# Patient Record
Sex: Female | Born: 1960 | Race: Black or African American | Hispanic: No | Marital: Married | Smoking: Current some day smoker
Health system: Southern US, Community
[De-identification: ages and names within clinical notes are randomized; demographics above are authoritative.]

## PROBLEM LIST (undated history)

## (undated) DIAGNOSIS — Z87891 Personal history of nicotine dependence: Secondary | ICD-10-CM

## (undated) DIAGNOSIS — F17200 Nicotine dependence, unspecified, uncomplicated: Secondary | ICD-10-CM

## (undated) DIAGNOSIS — R569 Unspecified convulsions: Secondary | ICD-10-CM

## (undated) DIAGNOSIS — N289 Disorder of kidney and ureter, unspecified: Secondary | ICD-10-CM

## (undated) DIAGNOSIS — T8859XA Other complications of anesthesia, initial encounter: Secondary | ICD-10-CM

## (undated) DIAGNOSIS — K921 Melena: Secondary | ICD-10-CM

## (undated) DIAGNOSIS — T4145XA Adverse effect of unspecified anesthetic, initial encounter: Secondary | ICD-10-CM

## (undated) DIAGNOSIS — I10 Essential (primary) hypertension: Secondary | ICD-10-CM

## (undated) DIAGNOSIS — K589 Irritable bowel syndrome without diarrhea: Secondary | ICD-10-CM

## (undated) DIAGNOSIS — K602 Anal fissure, unspecified: Secondary | ICD-10-CM

## (undated) DIAGNOSIS — G5603 Carpal tunnel syndrome, bilateral upper limbs: Secondary | ICD-10-CM

## (undated) DIAGNOSIS — E785 Hyperlipidemia, unspecified: Secondary | ICD-10-CM

## (undated) DIAGNOSIS — K219 Gastro-esophageal reflux disease without esophagitis: Secondary | ICD-10-CM

## (undated) HISTORY — PX: HAND SURGERY: SHX662

## (undated) HISTORY — PX: ABDOMINAL HYSTERECTOMY: SHX81

## (undated) HISTORY — PX: CHOLECYSTECTOMY: SHX55

## (undated) HISTORY — PX: KNEE SURGERY: SHX244

---
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
## 1999-02-18 ENCOUNTER — Emergency Department (HOSPITAL_COMMUNITY): Admission: EM | Admit: 1999-02-18 | Discharge: 1999-02-18 | Payer: Self-pay | Admitting: Emergency Medicine

## 1999-02-18 ENCOUNTER — Encounter: Payer: Self-pay | Admitting: Emergency Medicine

## 1999-05-05 ENCOUNTER — Encounter: Payer: Self-pay | Admitting: Emergency Medicine

## 1999-05-05 ENCOUNTER — Emergency Department (HOSPITAL_COMMUNITY): Admission: EM | Admit: 1999-05-05 | Discharge: 1999-05-05 | Payer: Self-pay | Admitting: Emergency Medicine

## 1999-07-27 ENCOUNTER — Encounter: Payer: Self-pay | Admitting: Emergency Medicine

## 1999-07-27 ENCOUNTER — Emergency Department (HOSPITAL_COMMUNITY): Admission: EM | Admit: 1999-07-27 | Discharge: 1999-07-27 | Payer: Self-pay | Admitting: Emergency Medicine

## 1999-12-01 ENCOUNTER — Encounter: Payer: Self-pay | Admitting: Emergency Medicine

## 1999-12-01 ENCOUNTER — Emergency Department (HOSPITAL_COMMUNITY): Admission: EM | Admit: 1999-12-01 | Discharge: 1999-12-01 | Payer: Self-pay | Admitting: Emergency Medicine

## 1999-12-21 ENCOUNTER — Emergency Department (HOSPITAL_COMMUNITY): Admission: EM | Admit: 1999-12-21 | Discharge: 1999-12-21 | Payer: Self-pay | Admitting: Emergency Medicine

## 2000-03-31 ENCOUNTER — Observation Stay (HOSPITAL_COMMUNITY): Admission: RE | Admit: 2000-03-31 | Discharge: 2000-04-01 | Payer: Self-pay | Admitting: *Deleted

## 2000-03-31 ENCOUNTER — Encounter (INDEPENDENT_AMBULATORY_CARE_PROVIDER_SITE_OTHER): Payer: Self-pay | Admitting: Specialist

## 2000-09-20 ENCOUNTER — Encounter: Payer: Self-pay | Admitting: Family Medicine

## 2000-09-20 ENCOUNTER — Ambulatory Visit (HOSPITAL_COMMUNITY): Admission: RE | Admit: 2000-09-20 | Discharge: 2000-09-20 | Payer: Self-pay | Admitting: Family Medicine

## 2001-01-17 ENCOUNTER — Encounter: Payer: Self-pay | Admitting: Emergency Medicine

## 2001-01-17 ENCOUNTER — Emergency Department (HOSPITAL_COMMUNITY): Admission: EM | Admit: 2001-01-17 | Discharge: 2001-01-17 | Payer: Self-pay | Admitting: Emergency Medicine

## 2001-05-25 ENCOUNTER — Emergency Department (HOSPITAL_COMMUNITY): Admission: EM | Admit: 2001-05-25 | Discharge: 2001-05-26 | Payer: Self-pay

## 2001-10-05 ENCOUNTER — Encounter: Payer: Self-pay | Admitting: *Deleted

## 2001-10-05 ENCOUNTER — Emergency Department (HOSPITAL_COMMUNITY): Admission: EM | Admit: 2001-10-05 | Discharge: 2001-10-05 | Payer: Self-pay | Admitting: *Deleted

## 2002-01-16 ENCOUNTER — Encounter: Payer: Self-pay | Admitting: *Deleted

## 2002-01-16 ENCOUNTER — Emergency Department (HOSPITAL_COMMUNITY): Admission: EM | Admit: 2002-01-16 | Discharge: 2002-01-16 | Payer: Self-pay | Admitting: *Deleted

## 2002-02-10 ENCOUNTER — Ambulatory Visit (HOSPITAL_COMMUNITY): Admission: RE | Admit: 2002-02-10 | Discharge: 2002-02-10 | Payer: Self-pay | Admitting: Orthopedic Surgery

## 2002-02-10 ENCOUNTER — Encounter: Payer: Self-pay | Admitting: Orthopedic Surgery

## 2002-07-07 ENCOUNTER — Inpatient Hospital Stay (HOSPITAL_COMMUNITY): Admission: EM | Admit: 2002-07-07 | Discharge: 2002-07-07 | Payer: Self-pay | Admitting: Emergency Medicine

## 2002-07-07 ENCOUNTER — Encounter: Payer: Self-pay | Admitting: Emergency Medicine

## 2004-04-26 ENCOUNTER — Emergency Department (HOSPITAL_COMMUNITY): Admission: EM | Admit: 2004-04-26 | Discharge: 2004-04-26 | Payer: Self-pay | Admitting: Emergency Medicine

## 2004-09-10 ENCOUNTER — Emergency Department (HOSPITAL_COMMUNITY): Admission: EM | Admit: 2004-09-10 | Discharge: 2004-09-10 | Payer: Self-pay | Admitting: Emergency Medicine

## 2007-04-08 ENCOUNTER — Emergency Department (HOSPITAL_COMMUNITY): Admission: EM | Admit: 2007-04-08 | Discharge: 2007-04-08 | Payer: Self-pay | Admitting: Emergency Medicine

## 2007-06-29 ENCOUNTER — Emergency Department (HOSPITAL_COMMUNITY): Admission: EM | Admit: 2007-06-29 | Discharge: 2007-06-29 | Payer: Self-pay | Admitting: Emergency Medicine

## 2007-08-25 ENCOUNTER — Emergency Department (HOSPITAL_COMMUNITY): Admission: EM | Admit: 2007-08-25 | Discharge: 2007-08-25 | Payer: Self-pay | Admitting: Emergency Medicine

## 2007-09-26 ENCOUNTER — Emergency Department (HOSPITAL_COMMUNITY): Admission: EM | Admit: 2007-09-26 | Discharge: 2007-09-26 | Payer: Self-pay | Admitting: Emergency Medicine

## 2007-11-19 ENCOUNTER — Ambulatory Visit (HOSPITAL_COMMUNITY): Admission: RE | Admit: 2007-11-19 | Discharge: 2007-11-19 | Payer: Self-pay | Admitting: Internal Medicine

## 2008-01-15 ENCOUNTER — Other Ambulatory Visit: Admission: RE | Admit: 2008-01-15 | Discharge: 2008-01-15 | Payer: Self-pay | Admitting: Internal Medicine

## 2008-04-04 ENCOUNTER — Emergency Department (HOSPITAL_COMMUNITY): Admission: EM | Admit: 2008-04-04 | Discharge: 2008-04-04 | Payer: Self-pay | Admitting: Emergency Medicine

## 2008-12-13 ENCOUNTER — Emergency Department (HOSPITAL_COMMUNITY): Admission: EM | Admit: 2008-12-13 | Discharge: 2008-12-13 | Payer: Self-pay | Admitting: Emergency Medicine

## 2009-05-05 ENCOUNTER — Emergency Department (HOSPITAL_COMMUNITY): Admission: EM | Admit: 2009-05-05 | Discharge: 2009-05-05 | Payer: Self-pay | Admitting: Emergency Medicine

## 2009-06-09 ENCOUNTER — Emergency Department (HOSPITAL_COMMUNITY): Admission: EM | Admit: 2009-06-09 | Discharge: 2009-06-09 | Payer: Self-pay | Admitting: Emergency Medicine

## 2009-06-14 ENCOUNTER — Inpatient Hospital Stay (HOSPITAL_COMMUNITY): Admission: EM | Admit: 2009-06-14 | Discharge: 2009-06-16 | Payer: Self-pay | Admitting: Emergency Medicine

## 2009-06-16 ENCOUNTER — Encounter (INDEPENDENT_AMBULATORY_CARE_PROVIDER_SITE_OTHER): Payer: Self-pay | Admitting: Neurology

## 2009-11-20 ENCOUNTER — Emergency Department (HOSPITAL_COMMUNITY): Admission: EM | Admit: 2009-11-20 | Discharge: 2009-11-20 | Payer: Self-pay | Admitting: Emergency Medicine

## 2010-09-28 ENCOUNTER — Emergency Department (HOSPITAL_COMMUNITY)
Admission: EM | Admit: 2010-09-28 | Discharge: 2010-09-28 | Disposition: A | Discharge status: Home / Self Care | Payer: Self-pay | Attending: Emergency Medicine | Admitting: Emergency Medicine

## 2010-09-28 DIAGNOSIS — N764 Abscess of vulva: Secondary | ICD-10-CM | POA: Insufficient documentation

## 2010-09-28 DIAGNOSIS — A5901 Trichomonal vulvovaginitis: Secondary | ICD-10-CM | POA: Insufficient documentation

## 2010-09-28 LAB — URINE MICROSCOPIC-ADD ON

## 2010-09-28 LAB — URINALYSIS, ROUTINE W REFLEX MICROSCOPIC
Ketones, ur: NEGATIVE mg/dL
Leukocytes, UA: NEGATIVE
Specific Gravity, Urine: 1.02 (ref 1.005–1.030)
pH: 5 (ref 5.0–8.0)

## 2010-09-28 LAB — WET PREP, GENITAL: Yeast Wet Prep HPF POC: NONE SEEN

## 2010-09-29 LAB — GC/CHLAMYDIA PROBE AMP, GENITAL: GC Probe Amp, Genital: NEGATIVE

## 2010-09-30 ENCOUNTER — Emergency Department (HOSPITAL_COMMUNITY)
Admission: EM | Admit: 2010-09-30 | Discharge: 2010-09-30 | Disposition: A | Discharge status: Home / Self Care | Payer: Self-pay | Attending: Emergency Medicine | Admitting: Emergency Medicine

## 2010-09-30 DIAGNOSIS — L02219 Cutaneous abscess of trunk, unspecified: Secondary | ICD-10-CM | POA: Insufficient documentation

## 2010-09-30 LAB — URINE CULTURE: Culture: NO GROWTH

## 2010-10-28 ENCOUNTER — Other Ambulatory Visit (HOSPITAL_COMMUNITY): Payer: Self-pay | Admitting: *Deleted

## 2010-10-28 DIAGNOSIS — Z139 Encounter for screening, unspecified: Secondary | ICD-10-CM

## 2010-11-04 ENCOUNTER — Ambulatory Visit (HOSPITAL_COMMUNITY): Payer: PRIVATE HEALTH INSURANCE

## 2010-11-08 ENCOUNTER — Ambulatory Visit (HOSPITAL_COMMUNITY)
Admission: RE | Admit: 2010-11-08 | Discharge: 2010-11-08 | Disposition: A | Discharge status: Home / Self Care | Payer: PRIVATE HEALTH INSURANCE | Source: Ambulatory Visit | Attending: Family Medicine | Admitting: Family Medicine

## 2010-11-08 ENCOUNTER — Other Ambulatory Visit (HOSPITAL_COMMUNITY): Payer: Self-pay | Admitting: Family Medicine

## 2010-11-08 DIAGNOSIS — Z1231 Encounter for screening mammogram for malignant neoplasm of breast: Secondary | ICD-10-CM | POA: Insufficient documentation

## 2010-11-08 DIAGNOSIS — Z139 Encounter for screening, unspecified: Secondary | ICD-10-CM

## 2010-11-09 ENCOUNTER — Other Ambulatory Visit: Payer: Self-pay | Admitting: Family Medicine

## 2010-11-09 DIAGNOSIS — R928 Other abnormal and inconclusive findings on diagnostic imaging of breast: Secondary | ICD-10-CM

## 2010-11-17 LAB — LIPID PANEL
Cholesterol: 222 mg/dL — ABNORMAL HIGH (ref 0–200)
HDL: 38 mg/dL — ABNORMAL LOW (ref >39)
Total CHOL/HDL Ratio: 5.8 RATIO
Triglycerides: 219 mg/dL — ABNORMAL HIGH (ref <150)

## 2010-11-17 LAB — GLUCOSE, CAPILLARY
Glucose-Capillary: 151 mg/dL — ABNORMAL HIGH (ref 70–99)
Glucose-Capillary: 170 mg/dL — ABNORMAL HIGH (ref 70–99)
Glucose-Capillary: 185 mg/dL — ABNORMAL HIGH (ref 70–99)

## 2010-11-18 LAB — URINALYSIS, ROUTINE W REFLEX MICROSCOPIC
Bilirubin Urine: NEGATIVE
Glucose, UA: 500 mg/dL — AB
Glucose, UA: NEGATIVE mg/dL
Specific Gravity, Urine: 1.025 (ref 1.005–1.030)
Specific Gravity, Urine: 1.023 (ref 1.005–1.030)
Urobilinogen, UA: 0.2 mg/dL (ref 0.0–1.0)
Urobilinogen, UA: 0.2 mg/dL (ref 0.0–1.0)
pH: 6 (ref 5.0–8.0)
pH: 5.5 (ref 5.0–8.0)

## 2010-11-18 LAB — BASIC METABOLIC PANEL
CO2: 30 mEq/L (ref 19–32)
Calcium: 9.5 mg/dL (ref 8.4–10.5)
Chloride: 104 mEq/L (ref 96–112)
Glucose, Bld: 185 mg/dL — ABNORMAL HIGH (ref 70–99)
Sodium: 139 mEq/L (ref 135–145)

## 2010-11-18 LAB — CBC
HCT: 41.4 % (ref 36.0–46.0)
HCT: 43.7 % (ref 36.0–46.0)
Hemoglobin: 14.4 g/dL (ref 12.0–15.0)
Hemoglobin: 15.1 g/dL — ABNORMAL HIGH (ref 12.0–15.0)
MCHC: 34.7 g/dL (ref 30.0–36.0)
MCHC: 34.6 g/dL (ref 30.0–36.0)
MCV: 92.3 fL (ref 78.0–100.0)
MCV: 92 fL (ref 78.0–100.0)
RBC: 4.75 MIL/uL (ref 3.87–5.11)
RDW: 13.6 % (ref 11.5–15.5)
RDW: 13.6 % (ref 11.5–15.5)

## 2010-11-18 LAB — COMPREHENSIVE METABOLIC PANEL
ALT: 21 U/L (ref 0–35)
ALT: 26 U/L (ref 0–35)
Alkaline Phosphatase: 58 U/L (ref 39–117)
Alkaline Phosphatase: 64 U/L (ref 39–117)
BUN: 11 mg/dL (ref 6–23)
BUN: 14 mg/dL (ref 6–23)
CO2: 18 mEq/L — ABNORMAL LOW (ref 19–32)
Chloride: 106 mEq/L (ref 96–112)
GFR calc non Af Amer: >60 mL/min (ref >60)
Glucose, Bld: 139 mg/dL — ABNORMAL HIGH (ref 70–99)
Glucose, Bld: 150 mg/dL — ABNORMAL HIGH (ref 70–99)
Potassium: 3.9 mEq/L (ref 3.5–5.1)
Potassium: 4.6 mEq/L (ref 3.5–5.1)
Sodium: 138 mEq/L (ref 135–145)
Sodium: 138 mEq/L (ref 135–145)
Total Bilirubin: 0.8 mg/dL (ref 0.3–1.2)
Total Bilirubin: 0.9 mg/dL (ref 0.3–1.2)
Total Protein: 6.8 g/dL (ref 6.0–8.3)

## 2010-11-18 LAB — URINE MICROSCOPIC-ADD ON

## 2010-11-18 LAB — CK TOTAL AND CKMB (NOT AT ARMC)
CK, MB: 0.9 ng/mL (ref 0.3–4.0)
Total CK: 101 U/L (ref 7–177)

## 2010-11-18 LAB — DIFFERENTIAL
Basophils Absolute: 0 10*3/uL (ref 0.0–0.1)
Basophils Absolute: 0.1 10*3/uL (ref 0.0–0.1)
Basophils Relative: 1 % (ref 0–1)
Basophils Relative: 1 % (ref 0–1)
Eosinophils Absolute: 0.4 10*3/uL (ref 0.0–0.7)
Eosinophils Absolute: 0.2 10*3/uL (ref 0.0–0.7)
Eosinophils Relative: 3 % (ref 0–5)
Monocytes Absolute: 0.8 10*3/uL (ref 0.1–1.0)
Monocytes Absolute: 0.9 10*3/uL (ref 0.1–1.0)
Neutro Abs: 6.3 10*3/uL (ref 1.7–7.7)
Neutro Abs: 8.9 10*3/uL — ABNORMAL HIGH (ref 1.7–7.7)
Neutrophils Relative %: 66 % (ref 43–77)

## 2010-11-18 LAB — PROTIME-INR: INR: 0.96 (ref 0.00–1.49)

## 2010-11-18 LAB — HEMOGLOBIN A1C: Hgb A1c MFr Bld: 8 % — ABNORMAL HIGH (ref 4.6–6.1)

## 2010-11-18 LAB — APTT: aPTT: 29 seconds (ref 24–37)

## 2010-12-01 ENCOUNTER — Ambulatory Visit (HOSPITAL_COMMUNITY): Payer: PRIVATE HEALTH INSURANCE

## 2010-12-22 ENCOUNTER — Ambulatory Visit (HOSPITAL_COMMUNITY)
Admission: RE | Admit: 2010-12-22 | Discharge: 2010-12-22 | Disposition: A | Discharge status: Home / Self Care | Payer: PRIVATE HEALTH INSURANCE | Source: Ambulatory Visit | Attending: Family Medicine | Admitting: Family Medicine

## 2010-12-22 DIAGNOSIS — R928 Other abnormal and inconclusive findings on diagnostic imaging of breast: Secondary | ICD-10-CM
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
## 2010-12-31 NOTE — Discharge Summary (Signed)
NAME:  Madeline Simpson, Madeline Simpson                  ACCOUNT NO.:  [id number]   MEDICAL RECORD NO.:  [id number]                   PATIENT TYPE:  INP   LOCATION:  5012                                 FACILITY:  [facility]   PHYSICIAN:  Santiago Bumpers. Hensel, M.D.             DATE OF BIRTH:  27-Nov-1960   DATE OF ADMISSION:  07/06/2002  DATE OF DISCHARGE:  07/07/2002                                 DISCHARGE SUMMARY   ADMISSION DIAGNOSES:  1. Rectal bleeding.  2. Chronic abdominal pain.   DISCHARGE DIAGNOSES:  1. Rectal bleeding.  2. Chronic abdominal pain.  3. Irritable bowel syndrome.   DISCHARGE MEDICATIONS:  Albuterol MDI two puffs every six hours as needed  for wheezing.   CHRONIC PROBLEM LIST:  1. Asthma.  2. Possible history of seizure disorder.  3. History of hemorrhoids.  4. Status post cholecystectomy.  5. Status post total abdominal hysterectomy secondary to fibroid tumor.  6. Multiple hand and knee surgeries.  7. History of numerous tubular pregnancies.  8. Nose surgery in the past.  9. Chronic intermittent lower abdominal pain.   SERVICE:  Conservation officer, historic buildings.   RESIDENTS:  1. Dr. Emmit Alexanders.  2. Dr. Jeoffrey Massed.   CONSULTANTS:  None.   PROCEDURE:  CT of the abdomen and pelvis showed scattered sigmoid  diverticula, no acute bleeds.   HISTORY OF PRESENT ILLNESS:  The patient is a 50 year old African American  female who presented to [facility] Emergency Department with complaint of bloody,  mucusy diarrhea that had been occurring off and on for the last four months  associated with some lower abdominal cramping.  She has an extensive past  medical history concerning pain in multiple areas of her body and had had a  previous abdominal workup including a negative colonoscopy in the past but  she could not remember which physician did this.  Initial evaluation  revealed normal vital signs and an abdominal exam showing diffuse tenderness  everywhere without  other abnormality.  Rectal exam did show no stool in the  vault and heme-positive testing.  Hemoglobin was 13.5 and the remainder of  her laboratory work was unremarkable.  A CT of the abdomen and pelvis was  done which showed scattered sigmoid diverticula.  After initial evaluation  in the ER, she returned from the CT scanner and had sharp epigastric  abdominal pain and began hyperventilating and became unresponsive for a  brief period of time.  During this time, her vital signs were normal.  She  was admitted for further observation.   HOSPITAL COURSE:  RECTAL BLEEDING:  She had no documented rectal bleeding  after admission and her abdominal pain  resolved.  She was given one dose of  Toradol and no narcotics.  She did not have any nausea or vomiting.  In  discussion of her history, it is highly positive that she has irritable  bowel syndrome but also has a high suspicion for  a somatoform disorder and  possibly anxiety disorder with somatic manifestations.  Given no evidence of  an acute abdominal process, she was discharged home and will need followup  with her primary M.D., which she states is [facility].  She also may warrant a GI consult as an outpatient for further clarification  of her bowel disorder, if any.   DISPOSITION:  The patient was discharged home in improved condition.   DISCHARGE MEDICATIONS:  She was instructed to resume her previous before-  admission medications and was not given any new prescriptions on discharge.   DIET AND ACTIVITY:  No specific recommendations were given for diet and she  was not given any restrictions on her activity.     Jeoffrey Massed, M.D.                   William A. Leveda Anna, M.D.    PHM/MEDQ  D:  07/07/2002  T:  07/08/2002  Job:  [id number]

## 2010-12-31 NOTE — Op Note (Signed)
[facility]  Patient:    Madeline Simpson, Madeline Simpson               MRN: [id number] Proc. Date: 03/31/00 Adm. Date:  [id number] Disc. Date: [id number] Attending:  Kandis Mannan CC:         Jamesetta Geralds, M.D.   Operative Report  PREOPERATIVE DIAGNOSIS:  Chronic cholecystitis with cholelithiasis.  POSTOPERATIVE DIAGNOSIS:  Chronic cholecystitis with cholelithiasis.  OPERATION PERFORMED:  Laparoscopic cholecystectomy.  SURGEON:  Maisie Fus B. Samuella Cota, M.D.  ASSISTANT:  Adolph Pollack, M.D.  ANESTHESIA:  General.  ANESTHESIOLOGIST:  Lucille Passy, M.D.  DESCRIPTION OF PROCEDURE:  The patient was taken to the operating room and placed on the table in the supine position.  After a satisfactory general anesthetic with intubation, the entire abdomen was prepped and draped as a sterile field.  A small vertical infraumbilical incision was made through skin and subcutaneous tissue.  The patient had a previous lower midline incision and also a curved infraumbilical incision.  The fascia was divided in the midline and a finger was placed into the peritoneal cavity.  There were some adhesions to the anterior abdominal wall, but an opening was made towards the liver.  A pursestring suture of 0 Vicryl was placed and the Hasson trocar placed in the abdomen and the abdomen insufflated to 14 mmHg pressure.  The scope was used to look into the pelvis, with adhesions from previous surgery and I was unable to identify any ovaries.  Attention was then directed to the gallbladder.  A second 10 mm trocar was placed just to the right of the midline in the subxiphoid area.  Two 5 mm trocar were placed laterally.  The gallbladder had adhesions extending all the way to the fundus of the gallbladder.  These adhesions were taken down with general traction.  The cystic duct was readily identified.  The cystic artery was also readily identified.  The cystic duct was small and  the fact that the patient had normal liver function test, I elected not to do an operative cholangiogram.  The cystic duct was easily seen going into the gallbladder. The cystic duct was triply clipped on the remaining side and once on the gallbladder side and divided.  There were some small stones in the cystic duct which were suctioned out.  The patient had two cystic arteries which were dissected free and triply clipped on the remaining side and once on the gallbladder side and divided. The gallbladder was dissected from the bed.  This seemed to be a very small bile duct about 2/3 of the way up the gallbladder bed.  This was doubly clipped and divided.  After the gallbladder had been freed, it was placed in an endocatch bag because the cystic duct hemoclip became loose and there was some spillage of bile.  The gallbladder was easily removed after being placed in the endocatch bag.  The right upper quadrant was copiously irrigated with 2-liters of saline.  No evidence of any bleeding or bile leak.  The two lateral trocars were removed under direct vision and there was no bleeding. The pursestring suture at the infraumbilical incision was tied to give good closure of that fascial defect.  The abdomen was deflated and a second 10 mm trocar was removed.  The two midline incisions were closed with running subcuticular sutures of 4-0 Vicryl and the two lateral trocar sites were closed with single subcuticular 5-0 Vicryl.  Benzoin and 1/4 inch Steri-Strips were used  to reinforce the skin closure.  Marcaine 0.25% without epinephrine was injected at all sites except the infraumbilical site.  Because of the adhesions around the site, I elected not to place a needle in this area.  The patient seemed to tolerate the procedure well and was taken to the PACU in satisfactory condition. DD:  03/31/00 TD:  04/03/00 Job: [id number]

## 2011-01-10 ENCOUNTER — Emergency Department (HOSPITAL_COMMUNITY)
Admission: EM | Admit: 2011-01-10 | Discharge: 2011-01-10 | Disposition: A | Discharge status: Home / Self Care | Payer: Self-pay | Attending: Emergency Medicine | Admitting: Emergency Medicine

## 2011-01-10 DIAGNOSIS — L02219 Cutaneous abscess of trunk, unspecified: Secondary | ICD-10-CM | POA: Insufficient documentation

## 2011-01-10 DIAGNOSIS — L03319 Cellulitis of trunk, unspecified: Secondary | ICD-10-CM | POA: Insufficient documentation

## 2011-01-10 DIAGNOSIS — F17200 Nicotine dependence, unspecified, uncomplicated: Secondary | ICD-10-CM | POA: Insufficient documentation

## 2011-01-10 DIAGNOSIS — E119 Type 2 diabetes mellitus without complications: Secondary | ICD-10-CM | POA: Insufficient documentation

## 2011-01-10 LAB — BASIC METABOLIC PANEL
BUN: 14 mg/dL (ref 6–23)
Calcium: 9.8 mg/dL (ref 8.4–10.5)
Creatinine, Ser: 0.57 mg/dL (ref 0.4–1.2)
GFR calc non Af Amer: >60 mL/min (ref >60)

## 2011-01-10 LAB — DIFFERENTIAL
Basophils Absolute: 0.1 10*3/uL (ref 0.0–0.1)
Eosinophils Relative: 2 % (ref 0–5)
Lymphocytes Relative: 33 % (ref 12–46)
Lymphs Abs: 3.9 10*3/uL (ref 0.7–4.0)
Monocytes Absolute: 0.9 10*3/uL (ref 0.1–1.0)
Neutro Abs: 6.8 10*3/uL (ref 1.7–7.7)

## 2011-01-10 LAB — CBC
MCH: 30.6 pg (ref 26.0–34.0)
MCV: 88.6 fL (ref 78.0–100.0)
Platelets: 358 10*3/uL (ref 150–400)
RDW: 13 % (ref 11.5–15.5)

## 2011-01-10 LAB — GLUCOSE, CAPILLARY

## 2011-05-06 LAB — URINE MICROSCOPIC-ADD ON

## 2011-05-06 LAB — URINALYSIS, ROUTINE W REFLEX MICROSCOPIC
Ketones, ur: NEGATIVE
Nitrite: NEGATIVE
Protein, ur: NEGATIVE

## 2011-05-24 LAB — POCT CARDIAC MARKERS
CKMB, poc: <1 — ABNORMAL LOW
Myoglobin, poc: 38.4
Operator id: 106841

## 2011-05-24 LAB — DIFFERENTIAL
Basophils Absolute: 0.1
Basophils Relative: 1
Monocytes Relative: 6
Neutro Abs: 7.4
Neutrophils Relative %: 59

## 2011-05-24 LAB — CBC
MCHC: 34.1
Platelets: 419 — ABNORMAL HIGH
RBC: 4.24
RDW: 13.4

## 2011-05-24 LAB — URINALYSIS, ROUTINE W REFLEX MICROSCOPIC
Bilirubin Urine: NEGATIVE
Nitrite: NEGATIVE
Specific Gravity, Urine: 1.015
pH: 6.5

## 2011-05-24 LAB — URINE MICROSCOPIC-ADD ON

## 2011-05-24 LAB — BASIC METABOLIC PANEL
CO2: 29
Calcium: 9.2
Creatinine, Ser: 0.88
GFR calc Af Amer: >60

## 2011-10-10 ENCOUNTER — Other Ambulatory Visit: Payer: Self-pay

## 2011-10-10 ENCOUNTER — Inpatient Hospital Stay (HOSPITAL_COMMUNITY)
Admission: EM | Admit: 2011-10-10 | Discharge: 2011-10-13 | DRG: 313 | Disposition: A | Discharge status: Home / Self Care | Payer: 59 | Attending: Internal Medicine | Admitting: Internal Medicine

## 2011-10-10 ENCOUNTER — Emergency Department (HOSPITAL_COMMUNITY): Payer: 59

## 2011-10-10 ENCOUNTER — Encounter (HOSPITAL_COMMUNITY): Payer: Self-pay | Admitting: Adult Health

## 2011-10-10 DIAGNOSIS — R079 Chest pain, unspecified: Secondary | ICD-10-CM | POA: Diagnosis present

## 2011-10-10 DIAGNOSIS — R0789 Other chest pain: Principal | ICD-10-CM | POA: Diagnosis present

## 2011-10-10 DIAGNOSIS — E785 Hyperlipidemia, unspecified: Secondary | ICD-10-CM | POA: Diagnosis present

## 2011-10-10 DIAGNOSIS — Z90711 Acquired absence of uterus with remaining cervical stump: Secondary | ICD-10-CM | POA: Insufficient documentation

## 2011-10-10 DIAGNOSIS — Z8249 Family history of ischemic heart disease and other diseases of the circulatory system: Secondary | ICD-10-CM

## 2011-10-10 DIAGNOSIS — R42 Dizziness and giddiness: Secondary | ICD-10-CM | POA: Diagnosis present

## 2011-10-10 DIAGNOSIS — D72829 Elevated white blood cell count, unspecified: Secondary | ICD-10-CM | POA: Diagnosis present

## 2011-10-10 DIAGNOSIS — B379 Candidiasis, unspecified: Secondary | ICD-10-CM | POA: Diagnosis present

## 2011-10-10 DIAGNOSIS — I10 Essential (primary) hypertension: Secondary | ICD-10-CM | POA: Diagnosis present

## 2011-10-10 DIAGNOSIS — R197 Diarrhea, unspecified: Secondary | ICD-10-CM | POA: Diagnosis present

## 2011-10-10 DIAGNOSIS — Z9889 Other specified postprocedural states: Secondary | ICD-10-CM | POA: Insufficient documentation

## 2011-10-10 DIAGNOSIS — I951 Orthostatic hypotension: Secondary | ICD-10-CM | POA: Diagnosis present

## 2011-10-10 DIAGNOSIS — Z794 Long term (current) use of insulin: Secondary | ICD-10-CM

## 2011-10-10 DIAGNOSIS — Z9049 Acquired absence of other specified parts of digestive tract: Secondary | ICD-10-CM | POA: Insufficient documentation

## 2011-10-10 DIAGNOSIS — R109 Unspecified abdominal pain: Secondary | ICD-10-CM | POA: Diagnosis present

## 2011-10-10 DIAGNOSIS — Z87891 Personal history of nicotine dependence: Secondary | ICD-10-CM

## 2011-10-10 DIAGNOSIS — K625 Hemorrhage of anus and rectum: Secondary | ICD-10-CM | POA: Diagnosis present

## 2011-10-10 DIAGNOSIS — K5289 Other specified noninfective gastroenteritis and colitis: Secondary | ICD-10-CM | POA: Diagnosis present

## 2011-10-10 DIAGNOSIS — E119 Type 2 diabetes mellitus without complications: Secondary | ICD-10-CM | POA: Diagnosis present

## 2011-10-10 HISTORY — DX: Adverse effect of unspecified anesthetic, initial encounter: T41.45XA

## 2011-10-10 HISTORY — DX: Hyperlipidemia, unspecified: E78.5

## 2011-10-10 HISTORY — DX: Personal history of nicotine dependence: Z87.891

## 2011-10-10 HISTORY — DX: Other complications of anesthesia, initial encounter: T88.59XA

## 2011-10-10 HISTORY — DX: Essential (primary) hypertension: I10

## 2011-10-10 LAB — URINALYSIS, ROUTINE W REFLEX MICROSCOPIC
Leukocytes, UA: NEGATIVE
Nitrite: NEGATIVE
Protein, ur: 100 mg/dL — AB
Urobilinogen, UA: 0.2 mg/dL (ref 0.0–1.0)

## 2011-10-10 LAB — PROTIME-INR
INR: 0.88 (ref 0.00–1.49)
INR: 0.89 (ref 0.00–1.49)
Prothrombin Time: 12.1 seconds (ref 11.6–15.2)
Prothrombin Time: 12.2 seconds (ref 11.6–15.2)

## 2011-10-10 LAB — HEPATIC FUNCTION PANEL
ALT: 14 U/L (ref 0–35)
AST: 10 U/L (ref 0–37)
Albumin: 4 g/dL (ref 3.5–5.2)
Total Protein: 7.2 g/dL (ref 6.0–8.3)
Total Protein: 6.4 g/dL (ref 6.0–8.3)

## 2011-10-10 LAB — BASIC METABOLIC PANEL
Calcium: 9.8 mg/dL (ref 8.4–10.5)
Creatinine, Ser: 0.56 mg/dL (ref 0.50–1.10)
GFR calc non Af Amer: >90 mL/min (ref >90)
Sodium: 137 mEq/L (ref 135–145)

## 2011-10-10 LAB — POCT I-STAT TROPONIN I: Troponin i, poc: 0 ng/mL (ref 0.00–0.08)

## 2011-10-10 LAB — CARDIAC PANEL(CRET KIN+CKTOT+MB+TROPI): CK, MB: 1.7 ng/mL (ref 0.3–4.0)

## 2011-10-10 LAB — APTT: aPTT: 30 seconds (ref 24–37)

## 2011-10-10 LAB — CBC
MCH: 30.5 pg (ref 26.0–34.0)
MCHC: 35.2 g/dL (ref 30.0–36.0)
Platelets: 414 10*3/uL — ABNORMAL HIGH (ref 150–400)

## 2011-10-10 LAB — URINE MICROSCOPIC-ADD ON

## 2011-10-10 LAB — PRO B NATRIURETIC PEPTIDE: Pro B Natriuretic peptide (BNP): 27.7 pg/mL (ref 0–125)

## 2011-10-10 MED ORDER — OXYCODONE HCL 5 MG PO TABS
5.0000 mg | ORAL_TABLET | ORAL | Status: DC | PRN
  Administered 2011-10-11 – 2011-10-13 (×4): 5 mg via ORAL
  Filled 2011-10-10 (×4): qty 1

## 2011-10-10 MED ORDER — SODIUM CHLORIDE 0.9 % IJ SOLN: 3.0000 mL | INTRAMUSCULAR | Status: DC | PRN

## 2011-10-10 MED ORDER — SODIUM CHLORIDE 0.9 % IV SOLN
20.0000 mL | INTRAVENOUS | Status: DC
  Administered 2011-10-10: 1000 mL via INTRAVENOUS
  Administered 2011-10-11: 20 mL via INTRAVENOUS

## 2011-10-10 MED ORDER — ONDANSETRON HCL 4 MG/2ML IJ SOLN
4.0000 mg | Freq: Once | INTRAMUSCULAR | Status: AC
  Administered 2011-10-10: 4 mg via INTRAVENOUS
  Filled 2011-10-10: qty 2

## 2011-10-10 MED ORDER — SODIUM CHLORIDE 0.9 % IJ SOLN: 3.0000 mL | Freq: Two times a day (BID) | INTRAMUSCULAR | Status: DC

## 2011-10-10 MED ORDER — NITROGLYCERIN 0.4 MG SL SUBL: 0.4000 mg | SUBLINGUAL_TABLET | SUBLINGUAL | Status: DC | PRN

## 2011-10-10 MED ORDER — ENOXAPARIN SODIUM 40 MG/0.4ML ~~LOC~~ SOLN
40.0000 mg | SUBCUTANEOUS | Status: DC
  Administered 2011-10-10: 40 mg via SUBCUTANEOUS
  Filled 2011-10-10 (×3): qty 0.4

## 2011-10-10 MED ORDER — NAPHAZOLINE-PHENIRAMINE 0.025-0.3 % OP SOLN: 1.0000 [drp] | Freq: Four times a day (QID) | OPHTHALMIC | Status: DC | PRN

## 2011-10-10 MED ORDER — ONDANSETRON HCL 4 MG/2ML IJ SOLN
4.0000 mg | Freq: Four times a day (QID) | INTRAMUSCULAR | Status: DC | PRN
  Administered 2011-10-10 – 2011-10-11 (×2): 4 mg via INTRAVENOUS
  Filled 2011-10-10 (×2): qty 2

## 2011-10-10 MED ORDER — MORPHINE SULFATE 2 MG/ML IJ SOLN
2.0000 mg | INTRAMUSCULAR | Status: DC | PRN
  Administered 2011-10-10 – 2011-10-12 (×6): 2 mg via INTRAVENOUS
  Filled 2011-10-10 (×6): qty 1

## 2011-10-10 MED ORDER — PANTOPRAZOLE SODIUM 40 MG PO TBEC
40.0000 mg | DELAYED_RELEASE_TABLET | Freq: Every day | ORAL | Status: DC
  Administered 2011-10-11 – 2011-10-13 (×3): 40 mg via ORAL
  Filled 2011-10-10 (×6): qty 1

## 2011-10-10 MED ORDER — SODIUM CHLORIDE 0.9 % IV SOLN: 250.0000 mL | INTRAVENOUS | Status: DC | PRN

## 2011-10-10 MED ORDER — LISINOPRIL 5 MG PO TABS
5.0000 mg | ORAL_TABLET | Freq: Every day | ORAL | Status: DC
  Administered 2011-10-10 – 2011-10-11 (×2): 5 mg via ORAL
  Filled 2011-10-10 (×2): qty 1

## 2011-10-10 MED ORDER — ACETAMINOPHEN 650 MG RE SUPP: 650.0000 mg | Freq: Four times a day (QID) | RECTAL | Status: DC | PRN

## 2011-10-10 MED ORDER — SODIUM CHLORIDE 0.9 % IJ SOLN
3.0000 mL | Freq: Two times a day (BID) | INTRAMUSCULAR | Status: DC
  Administered 2011-10-11 – 2011-10-12 (×3): 3 mL via INTRAVENOUS

## 2011-10-10 MED ORDER — SODIUM CHLORIDE 0.9 % IV SOLN: 20.0000 mL | INTRAVENOUS | Status: DC

## 2011-10-10 MED ORDER — ASPIRIN EC 325 MG PO TBEC
325.0000 mg | DELAYED_RELEASE_TABLET | Freq: Every day | ORAL | Status: DC
  Filled 2011-10-10: qty 1

## 2011-10-10 MED ORDER — ADULT MULTIVITAMIN W/MINERALS CH
1.0000 | ORAL_TABLET | Freq: Every day | ORAL | Status: DC
  Administered 2011-10-11 – 2011-10-13 (×3): 1 via ORAL
  Filled 2011-10-10 (×3): qty 1

## 2011-10-10 MED ORDER — ALUM & MAG HYDROXIDE-SIMETH 200-200-20 MG/5ML PO SUSP: 30.0000 mL | Freq: Four times a day (QID) | ORAL | Status: DC | PRN

## 2011-10-10 MED ORDER — SIMVASTATIN 20 MG PO TABS
20.0000 mg | ORAL_TABLET | Freq: Every day | ORAL | Status: DC
  Administered 2011-10-11 – 2011-10-12 (×2): 20 mg via ORAL
  Filled 2011-10-10 (×3): qty 1

## 2011-10-10 MED ORDER — INSULIN GLARGINE 100 UNIT/ML ~~LOC~~ SOLN
10.0000 [IU] | Freq: Every day | SUBCUTANEOUS | Status: DC
  Administered 2011-10-10: 10 [IU] via SUBCUTANEOUS
  Filled 2011-10-10: qty 3

## 2011-10-10 MED ORDER — ACETAMINOPHEN 325 MG PO TABS: 650.0000 mg | ORAL_TABLET | Freq: Four times a day (QID) | ORAL | Status: DC | PRN

## 2011-10-10 MED ORDER — NITROGLYCERIN 2 % TD OINT: 1.0000 [in_us] | TOPICAL_OINTMENT | Freq: Four times a day (QID) | TRANSDERMAL | Status: DC

## 2011-10-10 MED ORDER — ASPIRIN EC 325 MG PO TBEC
325.0000 mg | DELAYED_RELEASE_TABLET | Freq: Every day | ORAL | Status: DC
  Administered 2011-10-11 – 2011-10-13 (×3): 325 mg via ORAL
  Filled 2011-10-10 (×3): qty 1

## 2011-10-10 MED ORDER — INSULIN ASPART 100 UNIT/ML ~~LOC~~ SOLN
0.0000 [IU] | Freq: Three times a day (TID) | SUBCUTANEOUS | Status: DC
  Administered 2011-10-11: 3 [IU] via SUBCUTANEOUS
  Administered 2011-10-11: 5 [IU] via SUBCUTANEOUS
  Administered 2011-10-11: 3 [IU] via SUBCUTANEOUS
  Administered 2011-10-12: 5 [IU] via SUBCUTANEOUS
  Administered 2011-10-12: 3 [IU] via SUBCUTANEOUS
  Administered 2011-10-12: 5 [IU] via SUBCUTANEOUS
  Administered 2011-10-13 (×2): 3 [IU] via SUBCUTANEOUS
  Filled 2011-10-10: qty 3

## 2011-10-10 MED ORDER — SENNOSIDES-DOCUSATE SODIUM 8.6-50 MG PO TABS
1.0000 | ORAL_TABLET | Freq: Every evening | ORAL | Status: DC | PRN
  Administered 2011-10-11: 1 via ORAL
  Filled 2011-10-10: qty 1

## 2011-10-10 MED ORDER — ONDANSETRON HCL 4 MG PO TABS: 4.0000 mg | ORAL_TABLET | Freq: Four times a day (QID) | ORAL | Status: DC | PRN

## 2011-10-10 MED ORDER — ASPIRIN 81 MG PO CHEW
324.0000 mg | CHEWABLE_TABLET | Freq: Once | ORAL | Status: AC
  Administered 2011-10-10: 324 mg via ORAL
  Filled 2011-10-10: qty 4

## 2011-10-10 NOTE — H&P (Signed)
Madeline Simpson MRN: [id number] DOB/AGE: 1960-10-28 51 y.o. Primary Care Physician: [facility] [place], [place]. Admit date: 10/10/2011 Chief Complaint: Chest pain HPI: Madeline Simpson is a 51 year old African American female with history of type 2 diabetes diagnosed in October of 2010, history of hypertension, hyperlipidemia, prior history of tobacco abuse quit 6 months ago family history of coronary artery disease on her father's side however no known premature coronary artery disease presented to the ED with left-sided substernal chest pain. Patient states that the night prior to admission experienced some chest pain while sitting down and taken phone calls at work. Patient described the chest pain as a pinprick and lasted anywhere from 30 minutes to 3 hours. Patient states that she did not think much of it at that time and breast above. On the day of admission was at work and answering phone calls patient did describe left substernal chest pain described as a heaviness which have been constant in nature until she presented to the ED. Patient states left sided chest pain radiating to the left upper extremity does have some associated shortness of breath, dizziness, palpitations/fluttering feeling, diaphoresis and nausea. Vision denies any emesis does endorse some burping and flattens however denies any heartburn symptoms. Patient also endorses a fever of 102 about a week prior to admission which has since resolved patient does endorse some chills and has been having some loose stools over the past week with some associated bright red blood per echo which she states has been ongoing. Patient does endorse to the use of some Aleve and ibuprofen. Patient denies any recent antibiotic use. Patient does endorse some generalized weakness. Patient denies any dysuria. Patient does endorse some polyuria. Patient was seen in the ED EKG which was done showed a normal sinus rhythm first set of troponin  was negative will call to admit the patient for further evaluation and management.  Past Medical History  Diagnosis Date  . Diabetes mellitus   . Hypertension   . HTN (hypertension) 10/10/2011  . DM (diabetes mellitus), type 2 10/10/2011  . Hyperlipidemia 10/10/2011  . S/P cholecystectomy 10/10/2011  . H/O right knee surgery 10/10/2011  . S/P partial hysterectomy 10/10/2011  . H/O hand surgery 10/10/2011  . H/O tobacco use, presenting hazards to health 10/10/2011    Past Surgical History  Procedure Date  . Cholecystectomy   . Abdominal hysterectomy     partial    Prior to Admission medications   Medication Sig Start Date End Date Taking? Authorizing Provider  ibuprofen (ADVIL,MOTRIN) 200 MG tablet Take 200 mg by mouth every 6 (six) hours as needed. For pain relief   Yes Historical Provider, MD  insulin glargine (LANTUS) 100 UNIT/ML injection Inject 10 Units into the skin at bedtime.   Yes Historical Provider, MD  lisinopril (PRINIVIL,ZESTRIL) 5 MG tablet Take 5 mg by mouth daily.   Yes Historical Provider, MD  metFORMIN (GLUCOPHAGE) 500 MG tablet Take 1,000 mg by mouth 2 (two) times daily with a meal.   Yes Historical Provider, MD  Multiple Vitamin (MULITIVITAMIN WITH MINERALS) TABS Take 1 tablet by mouth daily.   Yes Historical Provider, MD  naphazoline-pheniramine (NAPHCON-A) 0.025-0.3 % ophthalmic solution Place 1 drop into both eyes 4 (four) times daily as needed. For red eyes   Yes Historical Provider, MD  pravastatin (PRAVACHOL) 40 MG tablet Take 40 mg by mouth daily.   Yes Historical Provider, MD    Allergies:  Allergies  Allergen Reactions  . Codeine Hives, Itching  and Other (See Comments)    delirioius  . Naproxen Hives and Itching    History reviewed. No pertinent family history.  Social History:  reports that she quit smoking about 6 months ago. Her smoking use included Cigarettes. She has a 25 pack-year smoking history. She does not have any smokeless tobacco history  on file. She reports that she does not drink alcohol or use illicit drugs.  ROS: All systems reviewed with the patient and was positive as per HPI otherwise all other systems are negative.  PHYSICAL EXAM: Blood pressure 163/85, pulse 88, temperature 98.4 F (36.9 C), temperature source Oral, resp. rate 17, SpO2 100.00%. General: well-developed were marked in no acute cardiopulmonary distress.  HEENT: Normocephalic atraumatic. Pupils equal round and reactive to light and accommodation. Extraocular movements intact. Oropharynx is clear, no lesions, no exudates. Neck is supple no lymphadenopathy. No bruits, no goiter. Heart: Regular rate and rhythm, without murmurs, rubs, gallops. Lungs: Clear to auscultation bilaterally. Abdomen: Soft, nontender, nondistended, positive bowel sounds. Extremities: No clubbing cyanosis or edema with positive pedal pulses. Neuro: Alert and oriented x3. CN 2 through 12 are grossly intact. No focal deficits.     EKG: Normal sinus rhythm. No results found for this or any previous visit (from the past 240 hour(s)).   Lab results:  [facility] 10/10/11 1659  NA 137  K 3.8  CL 102  CO2 22  GLUCOSE 120*  BUN 10  CREATININE 0.56  CALCIUM 9.8  MG --  PHOS --    Basename 10/10/11 1659  AST 12  ALT 15  ALKPHOS 53  BILITOT 0.4  PROT 7.2  ALBUMIN 4.0   No results found for this basename: LIPASE:2,AMYLASE:2 in the last 72 hours  Basename 10/10/11 1659  WBC 12.2*  NEUTROABS --  HGB 13.4  HCT 38.1  MCV 86.8  PLT 414*   No results found for this basename: CKTOTAL:3,CKMB:3,CKMBINDEX:3,TROPONINI:3 in the last 72 hours No components found with this basename: POCBNP:3 No results found for this basename: DDIMER in the last 72 hours No results found for this basename: HGBA1C:2 in the last 72 hours No results found for this basename: CHOL:2,HDL:2,LDLCALC:2,TRIG:2,CHOLHDL:2,LDLDIRECT:2 in the last 72 hours No results found for this basename:  TSH,T4TOTAL,FREET3,T3FREE,THYROIDAB in the last 72 hours No results found for this basename: VITAMINB12:2,FOLATE:2,FERRITIN:2,TIBC:2,IRON:2,RETICCTPCT:2 in the last 72 hours Imaging results:  Dg Chest Portable 1 View  10/10/2011  *RADIOLOGY REPORT*  Clinical Data: Chest pain  PORTABLE CHEST - 1 VIEW  Comparison: 06/14/2009  Findings: Artifact overlies chest.  Heart size is normal. Mediastinal shadows are normal.  The lungs are clear.  No effusions.  No bony abnormalities.  IMPRESSION: No active disease  Original Report Authenticated By: Thomasenia Sales, M.D.   Impression/Plan:  Principal Problem:  *Chest pain Active Problems:  HTN (hypertension)  DM (diabetes mellitus), type 2  Hyperlipidemia  Leukocytosis  Dizziness - light-headed   #1 chest pain Patient is presenting with some left-sided chest pain with typical and atypical features. Patient does have multiple cardiac risk factors of type 2 diabetes, hypertension, hyperlipidemia, prior history of tobacco abuse. Will admit patient to telemetry bed. We'll cycle cardiac enzymes every 8 hours x3. Will check a d-dimer. Check a hemoglobin A1c, check a fasting lipid panel. Will check a 2-D echo. We'll place on oxygen, aspirin, nitroglycerin, morphine sulfate when necessary. Continue home regimen of statin, lisinopril. Will follow. If cardiac enzymes are elevated or 2-D echo is abnormal we'll need to consult with cardiology for further  evaluation of the inpatient stress test versus cardiac catheterization.  #2 leukocytosis Patient states she had a fever one week prior to admission. Patient also does complain of some loose stools. Will check a C. difficile PCR. Will check a UA with cultures and sensitivities. Chest x-ray was negative. Will follow.  #3 hypertension Continue home regimen of lisinopril.  #4 type 2 diabetes Continue oral dose of Lantus/11 and 10 units daily. Sliding scale insulin. Will hold oral hypoglycemic agents.  #5  hyperlipidemia Check a fasting lipid panel we'll place on a statin.  #6 dizziness Will check orthostatics. Monitor follow closely on telemetry.  #7 bright red blood per rectum Patient states this has been ongoing for several years space as she had colonoscopy endoscopies which have been unremarkable. Patient's hemoglobin is currently stable. We'll monitor H&H.  #8 diarrhea Check a C. difficile PCR. We'll place on contact precautions for now until C. difficile PCR is negative.  #9 prophylaxis Protonix for GI prophylaxis, Lovenox for DVT prophylaxis.  Aryon Nham 10/10/2011, 7:06 PM

## 2011-10-10 NOTE — ED Notes (Signed)
Pt ambulated to BR, urinated and then was ambulating back to her room when she fell landing on her L side. Smelling salts used and pts legs elevated and pt roused within seconds. Pt denies pain or injury and demonstrated full ROM actively. Pt was assisted back to bed , no injuries observed.

## 2011-10-10 NOTE — ED Notes (Addendum)
Chest pain that started yesterday radiates down left arm and intoleft chest associated with indigestion and nausea, SOB. Left fingers are numb and tingling. EKG done. Pt also rep[rts bleeding from rectum.

## 2011-10-10 NOTE — ED Provider Notes (Addendum)
History     CSN: [id number]  Arrival date & time 10/10/11  1533   First MD Initiated Contact with Patient 10/10/11 1549      Chief Complaint  Patient presents with  . Chest Pain    (Consider location/radiation/quality/duration/timing/severity/associated sxs/prior treatment) Patient is a 51 y.o. female presenting with chest pain. The history is provided by the patient.  Chest Pain The chest pain began yesterday. Chest pain occurs intermittently. The chest pain is unchanged. The severity of the pain is severe. The quality of the pain is described as heavy. The pain radiates to the left arm. Exacerbated by: not by eating or positions, movement might have made it worse. Primary symptoms include shortness of breath and nausea.    Yesterday she had a one hour episode of pin prick sensation in her left chest.  Then last night her heart started fluttering.  Today she felt her chest become heavy with tingling in her chest.  She also felt nauseated. Past Medical History  Diagnosis Date  . Diabetes mellitus   . Hypertension     No past surgical history on file.  No family history on file.  History  Substance Use Topics  . Smoking status: Never Smoker   . Smokeless tobacco: Not on file  . Alcohol Use: No    OB History    Grav Para Term Preterm Abortions TAB SAB Ect Mult Living                  Review of Systems  Respiratory: Positive for shortness of breath.   Cardiovascular: Positive for chest pain.  Gastrointestinal: Positive for nausea and blood in stool (ongoing for years).  All other systems reviewed and are negative.    Allergies  Codeine and Naproxen  Home Medications  No current outpatient prescriptions on file.  BP 156/95  Pulse 93  Temp(Src) 98.4 F (36.9 C) (Oral)  Resp 17  SpO2 100%  Physical Exam  Nursing note and vitals reviewed. Constitutional: She appears well-developed and well-nourished. No distress.  HENT:  Head: Normocephalic and  atraumatic.  Right Ear: External ear normal.  Left Ear: External ear normal.  Eyes: Conjunctivae are normal. Right eye exhibits no discharge. Left eye exhibits no discharge. No scleral icterus.  Neck: Neck supple. No tracheal deviation present.  Cardiovascular: Normal rate, regular rhythm and intact distal pulses.   Pulmonary/Chest: Effort normal and breath sounds normal. No stridor. No respiratory distress. She has no wheezes. She has no rales.  Abdominal: Soft. Bowel sounds are normal. She exhibits no distension. There is no tenderness. There is no rebound and no guarding.  Musculoskeletal: She exhibits no edema and no tenderness.  Neurological: She is alert. She has normal strength. No sensory deficit. Cranial nerve deficit:  no gross defecits noted. She exhibits normal muscle tone. She displays no seizure activity. Coordination normal.  Skin: Skin is warm and dry. No rash noted.  Psychiatric: She has a normal mood and affect.    ED Course  Procedures (including critical care time)  Date: 10/10/2011  Rate: 90  Rhythm: normal sinus rhythm  QRS Axis: normal  Intervals: normal  ST/T Wave abnormalities: normal  Conduction Disutrbances:none  Narrative Interpretation: Borderline R-wave progression anteriorly  Old EKG Reviewed: unchanged   Labs Reviewed  CBC - Abnormal; Notable for the following:    WBC 12.2 (*)    Platelets 414 (*)    All other components within normal limits  BASIC METABOLIC PANEL - Abnormal; Notable for  the following:    Glucose, Bld 120 (*)    All other components within normal limits  PRO B NATRIURETIC PEPTIDE  PROTIME-INR  APTT  HEPATIC FUNCTION PANEL  POCT I-STAT TROPONIN I   Dg Chest Portable 1 View  10/10/2011  *RADIOLOGY REPORT*  Clinical Data: Chest pain  PORTABLE CHEST - 1 VIEW  Comparison: 06/14/2009  Findings: Artifact overlies chest.  Heart size is normal. Mediastinal shadows are normal.  The lungs are clear.  No effusions.  No bony abnormalities.   IMPRESSION: No active disease  Original Report Authenticated By: Thomasenia Sales, M.D.    Diagnosis: Chest pain   MDM  Pt was escorted to the bathroom and had a syncopal episode.  Pt states she still has had intermittent heaviness.  With her cardiac risk factors, the patient warrants admission for further evaluation.  5:52 PM patient was reexamined after the fall in emergency department. She has no tenderness to palpation in her head her back. There are no significant injuries from this fall. I've explained the plan to the patient and she is comfortable with admission for further treatment and evaluation. She's not currently having any chest pain      Celene Kras, MD 10/10/11 1752  Celene Kras, MD 10/10/11 [phone]

## 2011-10-11 ENCOUNTER — Encounter (HOSPITAL_COMMUNITY): Payer: Self-pay | Admitting: Cardiology

## 2011-10-11 DIAGNOSIS — I369 Nonrheumatic tricuspid valve disorder, unspecified: Secondary | ICD-10-CM

## 2011-10-11 DIAGNOSIS — R079 Chest pain, unspecified: Secondary | ICD-10-CM

## 2011-10-11 LAB — DIFFERENTIAL
Basophils Absolute: 0 K/uL (ref 0.0–0.1)
Basophils Relative: 0 % (ref 0–1)
Eosinophils Absolute: 0.4 K/uL (ref 0.0–0.7)
Eosinophils Relative: 4 % (ref 0–5)
Lymphocytes Relative: 35 % (ref 12–46)
Lymphs Abs: 3.6 K/uL (ref 0.7–4.0)
Monocytes Absolute: 0.9 K/uL (ref 0.1–1.0)
Monocytes Relative: 9 % (ref 3–12)
Neutro Abs: 5.4 K/uL (ref 1.7–7.7)
Neutrophils Relative %: 52 % (ref 43–77)

## 2011-10-11 LAB — COMPREHENSIVE METABOLIC PANEL
ALT: 12 U/L (ref 0–35)
AST: 10 U/L (ref 0–37)
Albumin: 3.3 g/dL — ABNORMAL LOW (ref 3.5–5.2)
Alkaline Phosphatase: 44 U/L (ref 39–117)
Glucose, Bld: 205 mg/dL — ABNORMAL HIGH (ref 70–99)
Potassium: 3.7 mEq/L (ref 3.5–5.1)
Sodium: 136 mEq/L (ref 135–145)
Total Protein: 6.1 g/dL (ref 6.0–8.3)

## 2011-10-11 LAB — LIPID PANEL
Cholesterol: 150 mg/dL (ref 0–200)
HDL: 48 mg/dL
LDL Cholesterol: 48 mg/dL (ref 0–99)
Total CHOL/HDL Ratio: 3.1 ratio
Triglycerides: 269 mg/dL — ABNORMAL HIGH
VLDL: 54 mg/dL — ABNORMAL HIGH (ref 0–40)

## 2011-10-11 LAB — GLUCOSE, CAPILLARY
Glucose-Capillary: 268 mg/dL — ABNORMAL HIGH (ref 70–99)
Glucose-Capillary: 174 mg/dL — ABNORMAL HIGH (ref 70–99)
Glucose-Capillary: 220 mg/dL — ABNORMAL HIGH (ref 70–99)

## 2011-10-11 LAB — URINE CULTURE: Culture: NO GROWTH

## 2011-10-11 LAB — CARDIAC PANEL(CRET KIN+CKTOT+MB+TROPI)
CK, MB: 1.7 ng/mL (ref 0.3–4.0)
CK, MB: 1.6 ng/mL (ref 0.3–4.0)
Relative Index: INVALID (ref 0.0–2.5)
Relative Index: INVALID (ref 0.0–2.5)
Total CK: 57 U/L (ref 7–177)
Troponin I: <0.3 ng/mL (ref <0.30)
Troponin I: <0.3 ng/mL

## 2011-10-11 LAB — CBC
HCT: 35.6 % — ABNORMAL LOW (ref 36.0–46.0)
Hemoglobin: 12.3 g/dL (ref 12.0–15.0)
MCH: 30.1 pg (ref 26.0–34.0)
MCHC: 34.6 g/dL (ref 30.0–36.0)
MCV: 87 fL (ref 78.0–100.0)

## 2011-10-11 LAB — HEMOGLOBIN A1C: Mean Plasma Glucose: 229 mg/dL — ABNORMAL HIGH (ref <117)

## 2011-10-11 MED ORDER — FLUCONAZOLE 200 MG PO TABS
200.0000 mg | ORAL_TABLET | Freq: Once | ORAL | Status: AC
  Administered 2011-10-11: 200 mg via ORAL
  Filled 2011-10-11: qty 1

## 2011-10-11 MED ORDER — DIPHENHYDRAMINE HCL 25 MG PO CAPS
25.0000 mg | ORAL_CAPSULE | Freq: Once | ORAL | Status: AC
  Administered 2011-10-11: 25 mg via ORAL
  Filled 2011-10-11: qty 1

## 2011-10-11 MED ORDER — INSULIN GLARGINE 100 UNIT/ML ~~LOC~~ SOLN
15.0000 [IU] | Freq: Every day | SUBCUTANEOUS | Status: DC
  Administered 2011-10-11 – 2011-10-12 (×2): 15 [IU] via SUBCUTANEOUS

## 2011-10-11 MED ORDER — ONDANSETRON HCL 4 MG/2ML IJ SOLN: 4.0000 mg | Freq: Three times a day (TID) | INTRAMUSCULAR | Status: DC | PRN

## 2011-10-11 NOTE — Progress Notes (Signed)
UR CHART REVIEWED; B Hooria Gasparini RN, BSN, MHA 

## 2011-10-11 NOTE — Progress Notes (Signed)
Upon admission, orthostatic vitals taken. Pt was positive for orthostatic hypotension as her lying bp was 186/114 and her standing bp was 139/99. Upon standing, pt began to feel very lightheaded and dizzy. Pt sat down quickly and had a syncopal episode. On call MD made aware and orders were obtained to increase patients IV fluids over night and to re-check pts orthostatic vitals in the a.m. At this time patient says she feels better now that she is laying down. Will continue to monitor pt.

## 2011-10-11 NOTE — Progress Notes (Signed)
  Echocardiogram 2D Echocardiogram has been performed.  Cathie Beams Deneen 10/11/2011, 12:12 PM
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
## 2011-10-11 NOTE — Progress Notes (Signed)
Subjective: Patient still complaining of chest pain. Relates had some chest pain on Sunday but chest pain yesterday was different. It was needle, pressure, accompanied with SOB, diaphoresis. She also relates chest pain on exertion.  She had diarrhea last week, resolved. She has some abdominal pain bilateral on and off for last week. Pain getting better.   Objective: Filed Vitals:   10/11/11 0545 10/11/11 0546 10/11/11 0547 10/11/11 1010  BP: 149/95 146/96 134/84 145/90  Pulse: 86 84 90   Temp: 98.6 F (37 C)     TempSrc: Oral     Resp: 18     Height:      Weight:      SpO2: 96%      Weight change:   Intake/Output Summary (Last 24 hours) at 10/11/11 1128 Last data filed at 10/11/11 0935  Gross per 24 hour  Intake   1263 ml  Output    200 ml  Net   1063 ml    General: Alert, awake, oriented x3, in no acute distress.  HEENT: No bruits, no goiter.  Heart: Regular rate and rhythm, without murmurs, rubs, gallops.  Lungs: CTA, bilateral air movement.  Abdomen: Soft, mild tender, nondistended, positive bowel sounds.  Neuro: Grossly intact, nonfocal. Extremities; no edema  Lab Results:  Basename 10/11/11 0355 10/10/11 2045 10/10/11 1659  NA 136 -- 137  K 3.7 -- 3.8  CL 102 -- 102  CO2 24 -- 22  GLUCOSE 205* -- 120*  BUN 8 -- 10  CREATININE 0.64 -- 0.56  CALCIUM 9.3 -- 9.8  MG -- 1.5 --  PHOS -- -- --    Basename 10/11/11 0355 10/10/11 2045  AST 10 10  ALT 12 14  ALKPHOS 44 57  BILITOT 0.5 0.4  PROT 6.1 6.4  ALBUMIN 3.3* 3.6    Basename 10/11/11 0355 10/10/11 1659  WBC 10.3 12.2*  NEUTROABS 5.4 --  HGB 12.3 13.4  HCT 35.6* 38.1  MCV 87.0 86.8  PLT 379 414*    Basename 10/11/11 0355 10/10/11 2039  CKTOTAL 62 65  CKMB 1.7 1.7  CKMBINDEX -- --  TROPONINI <0.30 <0.30    Basename 10/10/11 2038  DDIMER 0.22    Basename 10/10/11 2045  HGBA1C 9.6*    Basename 10/11/11 0355  CHOL 150  HDL 48  LDLCALC 48  TRIG 269*  CHOLHDL 3.1  LDLDIRECT --     Basename 10/10/11 2045  TSH 1.224  T4TOTAL --  T3FREE --  THYROIDAB --    Basename 10/10/11 2038  VITAMINB12 419  FOLATE --  FERRITIN --  TIBC --  IRON --  RETICCTPCT --     Studies/Results: Dg Chest Portable 1 View  10/10/2011  *RADIOLOGY REPORT*  Clinical Data: Chest pain  PORTABLE CHEST - 1 VIEW  Comparison: 06/14/2009  Findings: Artifact overlies chest.  Heart size is normal. Mediastinal shadows are normal.  The lungs are clear.  No effusions.  No bony abnormalities.  IMPRESSION: No active disease  Original Report Authenticated By: Thomasenia Sales, M.D.    Medications: I have reviewed the patient's current medications.   Patient Active Hospital Problem List:  Chest pain (10/10/2011) With some typical feature, patient with multiple risk factors. Still complaining of chest pain, also chest pain on exertion. I will ask cardio to see patient. D dimer negative, cardiac enzymes negative. ECHO ordered.    HTN (hypertension) (10/10/2011)  hold lisinopril due to orthostatic.  DM (diabetes mellitus), type 2 (10/10/2011)   Uncontrolled. HBA1  c at 9. I will increase Lantus to 15 units.   Hyperlipidemia (10/10/2011) Continue with simvastatin.   Leukocytosis (10/10/2011)  resolved.  Dizziness - light-headed (10/10/2011) Likely secondary to orthostatic hypotension. Hold lisinopril. Continue with IV fluids.  Yeast infection; will give 1 dose fluconazole.   bright red blood per rectum  Patient states this has been ongoing for several years space as she had colonoscopy endoscopies which have been unremarkable. Patient's hemoglobin is currently stable. We'll monitor H&H. No blood per rectum recently.  diarrhea/abdominal pain; improved. ? Gastroenteritis.        LOS: 1 day   Seddrick Flax M.D.  [facility] 10/11/2011, 11:28 AM

## 2011-10-11 NOTE — Discharge Instructions (Signed)
   You have a Stress Test scheduled on 10/18/11 at 9:15am at [facility] Radiology Department. Your doctor has ordered this test to get a better idea of how your heart works.  Please arrive 15 minutes early for paperwork.   Instructions:  No food/drink after midnight the night before.   No caffeine/decaf products 24 hours before, including medicines such as Excedrin or Goody Powders. Call if there are any questions.   Wear comfortable clothes and shoes.   It is OK to take your morning meds with a sip of water EXCEPT for those types of medicines listed below or otherwise instructed.  Special Medication Instructions:  Beta blockers such as metoprolol (Lopressor/Toprol XL), atenolol (Tenormin), carvedilol (Coreg), nebivolol (Bystolic), propranolol (Inderal) should not be taken for 24 hours before the test.  Calcium channel blockers such as diltiazem (Cardizem) or verapmil (Calan) should not be taken for 24 hours before the test.  Remove nitroglycerin patches and do not take nitrate preparations such as Imdur/isosorbide the day of your test.  No Persantine/Theophylline or Aggrenox medicines should be used within 24 hours of the test.   What To Expect: The whole test will take several hours. When you arrive in the lab, the technician will inject a small amount of radioactive tracer into your arm through an IV while you are resting quietly. This helps [place] to form pictures of your heart. You will likely only feel a sting from the IV. After a waiting period, resting pictures will be obtained under a big camera. These are the "before" pictures.  Next, you will be prepped for the stress portion of the test. This may include either walking on a treadmill or receiving a medicine that helps to dilate blood vessels in your heart to simulate the effect of exercise on your heart. If you are walking on a treadmill, you will walk at different paces to try to get your heart rate to a goal number that  is based on your age. If your doctor has chosen the pharmacologic test, then you will receive a medicine through your IV that may cause temporary nausea, flushing, shortness of breath and sometimes chest discomfort or vomiting. This is typically short-lived and usually resolves quickly. Your blood pressure and heart rate will be monitored, and we will be watching your EKG on a computer screen for any changes. During this portion of the test, the radiologist will inject another small amount of radioactive tracer into your IV. After a waiting period, you will undergo a second set of pictures. These are the "after" pictures.  The doctor reading the test will compare the before-and-after images to look for evidence of heart blockages or heart weakness. In certain instances, this test is done over 2 days - your doctor will determine if this is needed.
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
## 2011-10-11 NOTE — Progress Notes (Signed)
Inpatient Diabetes Program Recommendations  AACE/ADA: New Consensus Statement on Inpatient Glycemic Control (2009)  Target Ranges:  Prepandial:   less than 140 mg/dL      Peak postprandial:   less than 180 mg/dL (1-2 hours)      Critically ill patients:  140 - 180 mg/dL   Reason for Visit: Elevated glucose and HgbA1C of 9.6%  Inpatient Diabetes Program Recommendations Insulin - Basal: Increase Lantus to 15 units qhs Insulin - Meal Coverage: Add Novolog 3 units TID for meal coverage Outpatient Referral: Please order outpatient diabetes education

## 2011-10-12 LAB — GLUCOSE, CAPILLARY
Glucose-Capillary: 238 mg/dL — ABNORMAL HIGH (ref 70–99)
Glucose-Capillary: 165 mg/dL — ABNORMAL HIGH (ref 70–99)
Glucose-Capillary: 236 mg/dL — ABNORMAL HIGH (ref 70–99)
Glucose-Capillary: 177 mg/dL — ABNORMAL HIGH (ref 70–99)
Glucose-Capillary: 230 mg/dL — ABNORMAL HIGH (ref 70–99)
Glucose-Capillary: 169 mg/dL — ABNORMAL HIGH (ref 70–99)

## 2011-10-12 LAB — CBC
Hemoglobin: 12.6 g/dL (ref 12.0–15.0)
MCH: 29.7 pg (ref 26.0–34.0)
MCHC: 34.1 g/dL (ref 30.0–36.0)
MCV: 87.3 fL (ref 78.0–100.0)
Platelets: 397 10*3/uL (ref 150–400)

## 2011-10-12 LAB — BASIC METABOLIC PANEL
Calcium: 8.9 mg/dL (ref 8.4–10.5)
Creatinine, Ser: 0.64 mg/dL (ref 0.50–1.10)
GFR calc non Af Amer: >90 mL/min (ref >90)
Glucose, Bld: 186 mg/dL — ABNORMAL HIGH (ref 70–99)
Sodium: 139 mEq/L (ref 135–145)

## 2011-10-12 MED ORDER — SODIUM CHLORIDE 0.9 % IV SOLN: 20.0000 mL | INTRAVENOUS | Status: DC

## 2011-10-12 MED ORDER — SODIUM CHLORIDE 0.9 % IV SOLN
1000.0000 mL | INTRAVENOUS | Status: DC
  Administered 2011-10-12 – 2011-10-13 (×3): 1000 mL via INTRAVENOUS

## 2011-10-12 NOTE — Progress Notes (Signed)
Subjective: Still with some dizziness. Chest pressure improved. Had a bowel movement.  Objective: Filed Vitals:   10/11/11 1608 10/11/11 2122 10/12/11 0448 10/12/11 0800  BP: 124/87 151/91 138/80 145/86  Pulse: 85 77 77 91  Temp:  98.9 F (37.2 C) 97.8 F (36.6 C) 98.6 F (37 C)  TempSrc:  Oral Oral Oral  Resp:  24 20 20   Height:      Weight:   91.581 kg (201 lb 14.4 oz)   SpO2: 98% 96% 95% 98%   Weight change: 5.851 kg (12 lb 14.4 oz)  Intake/Output Summary (Last 24 hours) at 10/12/11 0958 Last data filed at 10/12/11 0657  Gross per 24 hour  Intake 2182.5 ml  Output   1101 ml  Net 1081.5 ml    General: Alert, awake, oriented x3, in no acute distress.  HEENT: No bruits, no goiter.  Heart: Regular rate and rhythm, without murmurs, rubs, gallops.  Lungs: Crackles left side, bilateral air movement.  Abdomen: Soft, nontender, nondistended, positive bowel sounds.  Neuro: Grossly intact, nonfocal. Extremities; no edema.   Lab Results:  Basename 10/12/11 0436 10/11/11 0355 10/10/11 2045  NA 139 136 --  K 3.8 3.7 --  CL 105 102 --  CO2 23 24 --  GLUCOSE 186* 205* --  BUN 10 8 --  CREATININE 0.64 0.64 --  CALCIUM 8.9 9.3 --  MG -- -- 1.5  PHOS -- -- --    Basename 10/11/11 0355 10/10/11 2045  AST 10 10  ALT 12 14  ALKPHOS 44 57  BILITOT 0.5 0.4  PROT 6.1 6.4  ALBUMIN 3.3* 3.6   No results found for this basename: LIPASE:2,AMYLASE:2 in the last 72 hours  Basename 10/12/11 0436 10/11/11 0355  WBC 10.4 10.3  NEUTROABS -- 5.4  HGB 12.6 12.3  HCT 37.0 35.6*  MCV 87.3 87.0  PLT 397 379    Basename 10/11/11 1213 10/11/11 0355 10/10/11 2039  CKTOTAL 57 62 65  CKMB 1.6 1.7 1.7  CKMBINDEX -- -- --  TROPONINI <0.30 <0.30 <0.30   No components found with this basename: POCBNP:3  Basename 10/10/11 2038  DDIMER 0.22    Basename 10/10/11 2045  HGBA1C 9.6*    Basename 10/11/11 0355  CHOL 150  HDL 48  LDLCALC 48  TRIG 269*  CHOLHDL 3.1  LDLDIRECT --     Basename 10/10/11 2045  TSH 1.224  T4TOTAL --  T3FREE --  THYROIDAB --    Basename 10/10/11 2038  VITAMINB12 419  FOLATE --  FERRITIN --  TIBC --  IRON --  RETICCTPCT --    Micro Results: Recent Results (from the past 240 hour(s))  URINE CULTURE     Status: Normal   Collection Time   10/10/11  6:47 PM      Component Value Range Status Comment   Specimen Description URINE, CLEAN CATCH   Final    Special Requests NONE   Final    Culture  Setup Time [id number]   Final    Colony Count NO GROWTH   Final    Culture NO GROWTH   Final    Report Status 10/11/2011 FINAL   Final     Studies/Results: Dg Chest Portable 1 View  10/10/2011  *RADIOLOGY REPORT*  Clinical Data: Chest pain  PORTABLE CHEST - 1 VIEW  Comparison: 06/14/2009  Findings: Artifact overlies chest.  Heart size is normal. Mediastinal shadows are normal.  The lungs are clear.  No effusions.  No bony abnormalities.  IMPRESSION: No active disease  Original Report Authenticated By: Thomasenia Sales, M.D.    Medications: I have reviewed the patient's current medications.  Chest pain (10/10/2011)  . D dimer negative, cardiac enzymes negative. ECHO normal EF. Myoview out patient. Appreciate cardio.   HTN (hypertension) (10/10/2011) hold lisinopril due to orthostatic.  DM (diabetes mellitus), type 2 (10/10/2011) Uncontrolled. HBA1 c at 9. I will increase Lantus to 15 units.   Hyperlipidemia (10/10/2011) Continue with simvastatin.   Leukocytosis (10/10/2011) resolved.  Dizziness - light-headed (10/10/2011)  Likely secondary to orthostatic hypotension. Hold lisinopril. Increase IVF. Maybe component of autonomic dysfunction.  Yeast infection; will give 1 dose fluconazole. Resolved.  bright red blood per rectum  No episodes.  Patient states this has been ongoing for several years space as she had colonoscopy endoscopies which have been unremarkable. Patient's hemoglobin is currently stable. We'll monitor H&H. No blood  per rectum recently.   diarrhea/abdominal pain; improved. ? Gastroenteritis. Diarrhea resolved. C diff send this am.      LOS: 2 days   Belkis Norbeck M.D.  [facility] 10/12/2011, 9:58 AM

## 2011-10-13 LAB — GLUCOSE, CAPILLARY

## 2011-10-13 LAB — CLOSTRIDIUM DIFFICILE BY PCR: Toxigenic C. Difficile by PCR: NEGATIVE

## 2011-10-13 MED ORDER — PANTOPRAZOLE SODIUM 40 MG PO TBEC: 40.0000 mg | DELAYED_RELEASE_TABLET | Freq: Every day | ORAL | Status: DC

## 2011-10-13 MED ORDER — ASPIRIN 81 MG PO TABS: 81.0000 mg | ORAL_TABLET | Freq: Every day | ORAL | Status: DC

## 2011-10-13 MED ORDER — INSULIN GLARGINE 100 UNIT/ML ~~LOC~~ SOLN: 15.0000 [IU] | Freq: Every day | SUBCUTANEOUS | Status: DC

## 2011-10-13 NOTE — Discharge Summary (Signed)
Admit date: 10/10/2011 Discharge date: 10/13/2011  Primary Care Physician:  No primary provider on file.   Discharge Diagnoses:    . Chest pain, atypical, CI vs muscle skeletal pain.  Orthostatic hypotension, resolved. Gastroenteritis.  10/10/2011   . HTN (hypertension) 10/10/2011   . DM (diabetes mellitus), type 2 10/10/2011   . Hyperlipidemia 10/10/2011   . Leukocytosis 10/10/2011   . Dizziness - light-headed 10/10/2011              DISCHARGE MEDICATION: Medication List  As of 10/13/2011 10:42 AM   STOP taking these medications         ibuprofen 200 MG tablet      lisinopril 5 MG tablet         TAKE these medications         insulin glargine 100 UNIT/ML injection   Commonly known as: LANTUS   Inject 15 Units into the skin at bedtime.      metFORMIN 500 MG tablet   Commonly known as: GLUCOPHAGE   Take 1,000 mg by mouth 2 (two) times daily with a meal.      mulitivitamin with minerals Tabs   Take 1 tablet by mouth daily.      naphazoline-pheniramine 0.025-0.3 % ophthalmic solution   Commonly known as: NAPHCON-A   Place 1 drop into both eyes 4 (four) times daily as needed. For red eyes      pantoprazole 40 MG tablet   Commonly known as: PROTONIX   Take 1 tablet (40 mg total) by mouth daily at 6 (six) AM.      pravastatin 40 MG tablet   Commonly known as: PRAVACHOL   Take 40 mg by mouth daily.              Consults:  Cardiology, .    SIGNIFICANT DIAGNOSTIC STUDIES:  Dg Chest Portable 1 View  10/10/2011  *RADIOLOGY REPORT*  Clinical Data: Chest pain  PORTABLE CHEST - 1 VIEW  Comparison: 06/14/2009  Findings: Artifact overlies chest.  Heart size is normal. Mediastinal shadows are normal.  The lungs are clear.  No effusions.  No bony abnormalities.  IMPRESSION: No active disease  Original Report Authenticated By: Thomasenia Sales, M.D.     ECHO:    - Left ventricle: The cavity size was normal. There was mild concentric hypertrophy. Systolic  function was normal. The estimated ejection fraction was in the range of 55% to 65%. - Atrial septum: No defect or patent foramen ovale was identified.     Recent Results (from the past 240 hour(s))  URINE CULTURE     Status: Normal   Collection Time   10/10/11  6:47 PM      Component Value Range Status Comment   Specimen Description URINE, CLEAN CATCH   Final    Special Requests NONE   Final    Culture  Setup Time [id number]   Final    Colony Count NO GROWTH   Final    Culture NO GROWTH   Final    Report Status 10/11/2011 FINAL   Final   CLOSTRIDIUM DIFFICILE BY PCR     Status: Normal   Collection Time   10/12/11  3:58 PM      Component Value Range Status Comment   C difficile by pcr NEGATIVE  NEGATIVE  Final     BRIEF ADMITTING H & P: HPI: Madeline Simpson is a 51 year old African American female with history of type 2 diabetes diagnosed in October of  2010, history of hypertension, hyperlipidemia, prior history of tobacco abuse quit 6 months ago family history of coronary artery disease on her father's side however no known premature coronary artery disease presented to the ED with left-sided substernal chest pain. Patient states that the night prior to admission experienced some chest pain while sitting down and taken phone calls at work. Patient described the chest pain as a pinprick and lasted anywhere from 30 minutes to 3 hours. Patient states that she did not think much of it at that time and breast above. On the day of admission was at work and answering phone calls patient did describe left substernal chest pain described as a heaviness which have been constant in nature until she presented to the ED. Patient states left sided chest pain radiating to the left upper extremity does have some associated shortness of breath, dizziness, palpitations/fluttering feeling, diaphoresis and nausea. Vision denies any emesis does endorse some burping and flattens however denies any heartburn  symptoms. Patient also endorses a fever of 102 about a week prior to admission which has since resolved patient does endorse some chills and has been having some loose stools over the past week with some associated bright red blood per echo which she states has been ongoing. Patient does endorse to the use of some Aleve and ibuprofen. Patient denies any recent antibiotic use. Patient does endorse some generalized weakness. Patient denies any dysuria. Patient does endorse some polyuria. Patient was seen in the ED EKG which was done showed a normal sinus rhythm first set of troponin was negative will call to admit the patient for further evaluation and management.  Hospital Course;  Chest pain (10/10/2011)  Patient  was admitted to telemetry, cardiac enzymes x3 negative, EKG no ST segment elevation.  D dimer negative,  ECHO normal EF. Myoview out patient, recommended by cardiologist. Patient will follow with cardio [place].   HTN (hypertension) (10/10/2011) hold lisinopril due to orthostatic.  DM (diabetes mellitus), type 2 (10/10/2011) Uncontrolled. HBA1 c at 9. I will increase Lantus to 15 units. Needs to follow up with PCP.   Hyperlipidemia (10/10/2011) Continue with simvastatin.   Leukocytosis (10/10/2011) resolved.  Dizziness - light-headed (10/10/2011)  Likely secondary to orthostatic hypotension. Hold lisinopril.  Maybe component of autonomic dysfunction. Patient received IV fluids. Dizziness resolved.  Yeast infection; Patient recievd 1 dose fluconazole. Resolved.   History bright red blood per rectum  No episodes.  Patient states this has been ongoing for several years space as she had colonoscopy endoscopies which have been unremarkable. Patient's hemoglobin is currently stable. We'll monitor H&H. No blood per rectum recently.  Diarrhea/abdominal pain; Resolved.  Gastroenteritis. Diarrhea resolved. C diff negative.  Patient was discharged in stable condition.  Disposition and  Follow-up:  Discharge Orders    Future Appointments: Provider: Department: Dept Phone: Center:   10/18/2011 12:00 PM [facility] [phone] [facility]     Future Orders Please Complete By Expires   Diet - low sodium heart healthy      Increase activity slowly        Follow-up Information    Call [facility]. (FOR YOUR STRESS TEST. [facility] Radiology Department - please arrive 9:15am 10/18/11 (see instructions))    Contact information:   If you have any questions, contact the [employer] Office at [phone].          DISCHARGE EXAM:  General: Alert, awake, oriented x3, in no acute distress.  HEENT: No bruits, no goiter.  Heart: Regular rate and rhythm, without murmurs, rubs, gallops.  Lungs: Crackles left side, bilateral air movement.  Abdomen: Soft, nontender, nondistended, positive bowel sounds.  Neuro: Grossly intact, nonfocal.  Extremities; no edema.    Blood pressure 130/85, pulse 80, temperature 98.3 F (36.8 C), temperature source Oral, resp. rate 18, height 5\' 7"  (1.702 m), weight 91.445 kg (201 lb 9.6 oz), SpO2 99.00%.   Basename 10/12/11 0436 10/11/11 0355 10/10/11 2045  NA 139 136 --  K 3.8 3.7 --  CL 105 102 --  CO2 23 24 --  GLUCOSE 186* 205* --  BUN 10 8 --  CREATININE 0.64 0.64 --  CALCIUM 8.9 9.3 --  MG -- -- 1.5  PHOS -- -- --    Basename 10/11/11 0355 10/10/11 2045  AST 10 10  ALT 12 14  ALKPHOS 44 57  BILITOT 0.5 0.4  PROT 6.1 6.4  ALBUMIN 3.3* 3.6   Basename 10/12/11 0436 10/11/11 0355  WBC 10.4 10.3  NEUTROABS -- 5.4  HGB 12.6 12.3  HCT 37.0 35.6*  MCV 87.3 87.0  PLT 397 379    Signed: Jobe Mutch M.D. 10/13/2011, 10:42 AM

## 2011-10-25 ENCOUNTER — Other Ambulatory Visit: Payer: Self-pay

## 2011-10-27 ENCOUNTER — Ambulatory Visit (INDEPENDENT_AMBULATORY_CARE_PROVIDER_SITE_OTHER): Payer: 59

## 2011-10-27 ENCOUNTER — Encounter (HOSPITAL_COMMUNITY)
Admission: RE | Admit: 2011-10-27 | Discharge: 2011-10-27 | Disposition: A | Discharge status: Home / Self Care | Payer: 59 | Source: Ambulatory Visit | Attending: Cardiology | Admitting: Cardiology

## 2011-10-27 ENCOUNTER — Encounter (HOSPITAL_COMMUNITY): Payer: Self-pay

## 2011-10-27 DIAGNOSIS — R079 Chest pain, unspecified: Secondary | ICD-10-CM

## 2011-10-27 DIAGNOSIS — E785 Hyperlipidemia, unspecified: Secondary | ICD-10-CM | POA: Insufficient documentation

## 2011-10-27 DIAGNOSIS — E119 Type 2 diabetes mellitus without complications: Secondary | ICD-10-CM | POA: Insufficient documentation

## 2011-10-27 MED ORDER — TECHNETIUM TC 99M TETROFOSMIN IV KIT
30.0000 | PACK | Freq: Once | INTRAVENOUS | Status: AC | PRN
  Administered 2011-10-27: 30 via INTRAVENOUS

## 2011-10-27 MED ORDER — TECHNETIUM TC 99M TETROFOSMIN IV KIT
10.0000 | PACK | Freq: Once | INTRAVENOUS | Status: AC | PRN
  Administered 2011-10-27: 10.5 via INTRAVENOUS

## 2011-10-27 NOTE — Progress Notes (Signed)
Stress Lab Nurses Notes - Madeline Simpson 10/27/2011  Reason for doing test: Chest Pain Type of test: Stress Myoview Nurse performing test: Parke Poisson, RN Nuclear Medicine Tech: Lyndel Pleasure Echo Tech: Not Applicable MD performing test: Ival Bible & Joni Reining NP Family MD:  [facility] Test explained and consent signed: yes IV started: 22g jelco, Saline lock flushed, No redness or edema and Saline lock started in radiology Symptoms: fatigue and SOB.  Had some dizziness at the end while on TM assisted to chair. Treatment/Intervention: None Reason test stopped: fatigue and reached target HR After recovery IV was: Discontinued via X-ray tech and No redness or edema Patient to return to Nuc. Med at : 12:15 Patient discharged: Home Patient's Condition upon discharge was: stable Comments: Juice given prior to test. Patient feeling weak. "Better now".  During test peak BP 188/98 & HR 164. Had some dizziness on the TM at the end of exercising.  Recovery BP 158/90 & HR 106. Symptoms resolved in recovery. [name] T

## 2012-02-27 ENCOUNTER — Encounter (HOSPITAL_COMMUNITY): Payer: Self-pay | Admitting: Emergency Medicine

## 2012-02-27 ENCOUNTER — Inpatient Hospital Stay (HOSPITAL_COMMUNITY)
Admission: EM | Admit: 2012-02-27 | Discharge: 2012-03-01 | DRG: 638 | Disposition: A | Discharge status: Home / Self Care | Payer: 59 | Attending: Internal Medicine | Admitting: Internal Medicine

## 2012-02-27 DIAGNOSIS — Z888 Allergy status to other drugs, medicaments and biological substances status: Secondary | ICD-10-CM | POA: Diagnosis present

## 2012-02-27 DIAGNOSIS — Z90711 Acquired absence of uterus with remaining cervical stump: Secondary | ICD-10-CM

## 2012-02-27 DIAGNOSIS — E119 Type 2 diabetes mellitus without complications: Secondary | ICD-10-CM

## 2012-02-27 DIAGNOSIS — R42 Dizziness and giddiness: Secondary | ICD-10-CM

## 2012-02-27 DIAGNOSIS — E1100 Type 2 diabetes mellitus with hyperosmolarity without nonketotic hyperglycemic-hyperosmolar coma (NKHHC): Principal | ICD-10-CM | POA: Diagnosis present

## 2012-02-27 DIAGNOSIS — E785 Hyperlipidemia, unspecified: Secondary | ICD-10-CM | POA: Diagnosis present

## 2012-02-27 DIAGNOSIS — N390 Urinary tract infection, site not specified: Secondary | ICD-10-CM | POA: Diagnosis present

## 2012-02-27 DIAGNOSIS — Y9210 Unspecified residential institution as the place of occurrence of the external cause: Secondary | ICD-10-CM | POA: Diagnosis present

## 2012-02-27 DIAGNOSIS — Z87891 Personal history of nicotine dependence: Secondary | ICD-10-CM

## 2012-02-27 DIAGNOSIS — D72829 Elevated white blood cell count, unspecified: Secondary | ICD-10-CM

## 2012-02-27 DIAGNOSIS — Z9089 Acquired absence of other organs: Secondary | ICD-10-CM

## 2012-02-27 DIAGNOSIS — Z8249 Family history of ischemic heart disease and other diseases of the circulatory system: Secondary | ICD-10-CM

## 2012-02-27 DIAGNOSIS — Z9049 Acquired absence of other specified parts of digestive tract: Secondary | ICD-10-CM

## 2012-02-27 DIAGNOSIS — Z9889 Other specified postprocedural states: Secondary | ICD-10-CM

## 2012-02-27 DIAGNOSIS — I10 Essential (primary) hypertension: Secondary | ICD-10-CM | POA: Diagnosis present

## 2012-02-27 DIAGNOSIS — T3795XA Adverse effect of unspecified systemic anti-infective and antiparasitic, initial encounter: Secondary | ICD-10-CM | POA: Diagnosis present

## 2012-02-27 DIAGNOSIS — F17200 Nicotine dependence, unspecified, uncomplicated: Secondary | ICD-10-CM | POA: Diagnosis present

## 2012-02-27 DIAGNOSIS — R739 Hyperglycemia, unspecified: Secondary | ICD-10-CM | POA: Diagnosis present

## 2012-02-27 LAB — COMPREHENSIVE METABOLIC PANEL
ALT: 23 U/L (ref 0–35)
AST: 16 U/L (ref 0–37)
Alkaline Phosphatase: 75 U/L (ref 39–117)
CO2: 24 mEq/L (ref 19–32)
Calcium: 10.2 mg/dL (ref 8.4–10.5)
GFR calc Af Amer: >90 mL/min (ref >90)
Glucose, Bld: 569 mg/dL (ref 70–99)
Potassium: 4.1 mEq/L (ref 3.5–5.1)
Sodium: 136 mEq/L (ref 135–145)
Total Protein: 7.2 g/dL (ref 6.0–8.3)

## 2012-02-27 LAB — URINALYSIS, ROUTINE W REFLEX MICROSCOPIC
Leukocytes, UA: NEGATIVE
Nitrite: NEGATIVE
Specific Gravity, Urine: 1.042 — ABNORMAL HIGH (ref 1.005–1.030)
Urobilinogen, UA: 0.2 mg/dL (ref 0.0–1.0)
pH: 5 (ref 5.0–8.0)

## 2012-02-27 LAB — GLUCOSE, CAPILLARY
Glucose-Capillary: 484 mg/dL — ABNORMAL HIGH (ref 70–99)
Glucose-Capillary: 389 mg/dL — ABNORMAL HIGH (ref 70–99)

## 2012-02-27 LAB — CBC WITH DIFFERENTIAL/PLATELET
Basophils Absolute: 0.1 10*3/uL (ref 0.0–0.1)
Eosinophils Absolute: 0.2 10*3/uL (ref 0.0–0.7)
Lymphocytes Relative: 31 % (ref 12–46)
Lymphs Abs: 2.9 10*3/uL (ref 0.7–4.0)
Neutrophils Relative %: 59 % (ref 43–77)
Platelets: 385 10*3/uL (ref 150–400)
RBC: 4.79 MIL/uL (ref 3.87–5.11)
RDW: 12.9 % (ref 11.5–15.5)
WBC: 9.3 10*3/uL (ref 4.0–10.5)

## 2012-02-27 LAB — POCT I-STAT 3, VENOUS BLOOD GAS (G3P V)
Acid-base deficit: 1 mmol/L (ref 0.0–2.0)
O2 Saturation: 55 %

## 2012-02-27 LAB — URINE MICROSCOPIC-ADD ON

## 2012-02-27 MED ORDER — DEXTROSE 50 % IV SOLN: 25.0000 mL | INTRAVENOUS | Status: DC | PRN

## 2012-02-27 MED ORDER — SODIUM CHLORIDE 0.9 % IV SOLN
INTRAVENOUS | Status: DC
  Administered 2012-02-27: 4 [IU]/h via INTRAVENOUS
  Administered 2012-02-28: 7.5 [IU]/h via INTRAVENOUS
  Administered 2012-02-28: 9.6 [IU]/h via INTRAVENOUS
  Administered 2012-02-28: 4.2 [IU]/h via INTRAVENOUS
  Administered 2012-02-28: 9.5 [IU]/h via INTRAVENOUS
  Administered 2012-02-28: 9 [IU]/h via INTRAVENOUS
  Administered 2012-02-28: 9.7 [IU]/h via INTRAVENOUS
  Filled 2012-02-27: qty 1

## 2012-02-27 MED ORDER — DEXTROSE-NACL 5-0.45 % IV SOLN
INTRAVENOUS | Status: DC
  Administered 2012-02-28: 02:00:00 via INTRAVENOUS

## 2012-02-27 MED ORDER — HYDROMORPHONE HCL PF 1 MG/ML IJ SOLN
0.5000 mg | Freq: Once | INTRAMUSCULAR | Status: AC
  Administered 2012-02-27: 0.5 mg via INTRAVENOUS
  Filled 2012-02-27: qty 1

## 2012-02-27 MED ORDER — ONDANSETRON HCL 4 MG/2ML IJ SOLN
4.0000 mg | Freq: Three times a day (TID) | INTRAMUSCULAR | Status: AC | PRN
  Administered 2012-02-28: 4 mg via INTRAVENOUS
  Filled 2012-02-27: qty 2

## 2012-02-27 MED ORDER — HYDROMORPHONE HCL PF 1 MG/ML IJ SOLN
1.0000 mg | INTRAMUSCULAR | Status: AC | PRN
  Administered 2012-02-28 (×3): 1 mg via INTRAVENOUS
  Filled 2012-02-27 (×3): qty 1

## 2012-02-27 MED ORDER — SODIUM CHLORIDE 0.9 % IV SOLN: INTRAVENOUS | Status: DC

## 2012-02-27 MED ORDER — NYSTATIN 100000 UNIT/GM EX CREA
TOPICAL_CREAM | Freq: Two times a day (BID) | CUTANEOUS | Status: DC
  Administered 2012-02-27: 23:00:00 via TOPICAL
  Filled 2012-02-27 (×2): qty 15
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
## 2012-02-27 NOTE — ED Notes (Signed)
Critical high glucose high from lab 569

## 2012-02-27 NOTE — ED Notes (Signed)
Pt presented to ED with abdominal pain and hyperglycemia.

## 2012-02-27 NOTE — ED Notes (Signed)
Pt c/o abd pain and bloating with UTI sx with hematuria and pain with urination and wiping; pt sts leathargic and CBG reading in 300's which is high for norm

## 2012-02-28 DIAGNOSIS — N390 Urinary tract infection, site not specified: Secondary | ICD-10-CM

## 2012-02-28 DIAGNOSIS — R42 Dizziness and giddiness: Secondary | ICD-10-CM

## 2012-02-28 DIAGNOSIS — R7309 Other abnormal glucose: Secondary | ICD-10-CM

## 2012-02-28 DIAGNOSIS — Z87891 Personal history of nicotine dependence: Secondary | ICD-10-CM

## 2012-02-28 LAB — GLUCOSE, CAPILLARY
Glucose-Capillary: 221 mg/dL — ABNORMAL HIGH (ref 70–99)
Glucose-Capillary: 210 mg/dL — ABNORMAL HIGH (ref 70–99)
Glucose-Capillary: 196 mg/dL — ABNORMAL HIGH (ref 70–99)
Glucose-Capillary: 181 mg/dL — ABNORMAL HIGH (ref 70–99)
Glucose-Capillary: 172 mg/dL — ABNORMAL HIGH (ref 70–99)
Glucose-Capillary: 194 mg/dL — ABNORMAL HIGH (ref 70–99)
Glucose-Capillary: 315 mg/dL — ABNORMAL HIGH (ref 70–99)

## 2012-02-28 LAB — COMPREHENSIVE METABOLIC PANEL
ALT: 25 U/L (ref 0–35)
Alkaline Phosphatase: 56 U/L (ref 39–117)
CO2: 24 mEq/L (ref 19–32)
Chloride: 101 mEq/L (ref 96–112)
GFR calc Af Amer: >90 mL/min (ref >90)
GFR calc non Af Amer: >90 mL/min (ref >90)
Glucose, Bld: 204 mg/dL — ABNORMAL HIGH (ref 70–99)
Potassium: 3.1 mEq/L — ABNORMAL LOW (ref 3.5–5.1)
Sodium: 139 mEq/L (ref 135–145)
Total Bilirubin: 0.6 mg/dL (ref 0.3–1.2)

## 2012-02-28 LAB — HEMOGLOBIN A1C: Mean Plasma Glucose: 309 mg/dL — ABNORMAL HIGH (ref <117)

## 2012-02-28 LAB — MRSA PCR SCREENING: MRSA by PCR: NEGATIVE

## 2012-02-28 LAB — CBC
MCH: 30 pg (ref 26.0–34.0)
MCHC: 35.2 g/dL (ref 30.0–36.0)
RDW: 12.7 % (ref 11.5–15.5)

## 2012-02-28 MED ORDER — INSULIN GLARGINE 100 UNIT/ML ~~LOC~~ SOLN
12.0000 [IU] | SUBCUTANEOUS | Status: DC
  Administered 2012-02-28 – 2012-02-29 (×2): 12 [IU] via SUBCUTANEOUS

## 2012-02-28 MED ORDER — INSULIN GLARGINE 100 UNIT/ML ~~LOC~~ SOLN: 12.0000 [IU] | SUBCUTANEOUS | Status: DC

## 2012-02-28 MED ORDER — SODIUM CHLORIDE 0.9 % IV SOLN
INTRAVENOUS | Status: DC
  Administered 2012-02-28: 12.1 [IU]/h via INTRAVENOUS
  Administered 2012-02-28: 01:00:00 via INTRAVENOUS
  Administered 2012-02-28: 10.1 [IU]/h via INTRAVENOUS
  Filled 2012-02-28: qty 1

## 2012-02-28 MED ORDER — HYDRALAZINE HCL 20 MG/ML IJ SOLN
10.0000 mg | INTRAMUSCULAR | Status: DC | PRN
  Administered 2012-02-28: 10 mg via INTRAVENOUS
  Filled 2012-02-28: qty 1
  Filled 2012-02-28: qty 0.5

## 2012-02-28 MED ORDER — CIPROFLOXACIN IN D5W 400 MG/200ML IV SOLN
400.0000 mg | Freq: Two times a day (BID) | INTRAVENOUS | Status: DC
  Administered 2012-02-28: 400 mg via INTRAVENOUS
  Filled 2012-02-28 (×2): qty 200

## 2012-02-28 MED ORDER — ACETAMINOPHEN 325 MG PO TABS
650.0000 mg | ORAL_TABLET | ORAL | Status: DC | PRN
  Administered 2012-02-28 – 2012-03-01 (×5): 650 mg via ORAL
  Filled 2012-02-28 (×4): qty 2

## 2012-02-28 MED ORDER — NYSTATIN 100000 UNIT/GM EX POWD
Freq: Two times a day (BID) | CUTANEOUS | Status: DC
  Administered 2012-02-28 (×2): via TOPICAL
  Administered 2012-02-29: 1 g via TOPICAL
  Administered 2012-02-29 – 2012-03-01 (×2): via TOPICAL
  Filled 2012-02-28: qty 15

## 2012-02-28 MED ORDER — POTASSIUM CHLORIDE 20 MEQ/15ML (10%) PO LIQD
40.0000 meq | Freq: Once | ORAL | Status: AC
  Administered 2012-02-28: 40 meq via ORAL
  Filled 2012-02-28: qty 30

## 2012-02-28 MED ORDER — FLUCONAZOLE 150 MG PO TABS
150.0000 mg | ORAL_TABLET | Freq: Once | ORAL | Status: AC
  Administered 2012-02-28: 150 mg via ORAL
  Filled 2012-02-28: qty 1

## 2012-02-28 MED ORDER — INSULIN ASPART 100 UNIT/ML ~~LOC~~ SOLN
0.0000 [IU] | Freq: Three times a day (TID) | SUBCUTANEOUS | Status: DC
  Administered 2012-02-28: 2 [IU] via SUBCUTANEOUS
  Administered 2012-02-29: 8 [IU] via SUBCUTANEOUS
  Administered 2012-02-29: 11 [IU] via SUBCUTANEOUS

## 2012-02-28 MED ORDER — DIPHENHYDRAMINE HCL 50 MG/ML IJ SOLN
INTRAMUSCULAR | Status: AC
  Filled 2012-02-28: qty 1

## 2012-02-28 MED ORDER — HYDROCODONE-ACETAMINOPHEN 5-325 MG PO TABS
1.0000 | ORAL_TABLET | ORAL | Status: AC | PRN
  Administered 2012-02-28 – 2012-02-29 (×2): 1 via ORAL
  Filled 2012-02-28 (×2): qty 1

## 2012-02-28 MED ORDER — BLOOD PRESSURE CONTROL BOOK
Freq: Once | Status: AC
  Administered 2012-02-28: 03:00:00
  Filled 2012-02-28: qty 1

## 2012-02-28 MED ORDER — AMLODIPINE BESYLATE 5 MG PO TABS
5.0000 mg | ORAL_TABLET | Freq: Every day | ORAL | Status: DC
  Administered 2012-02-28 – 2012-02-29 (×2): 5 mg via ORAL
  Filled 2012-02-28 (×2): qty 1

## 2012-02-28 MED ORDER — LIVING WELL WITH DIABETES BOOK
Freq: Once | Status: AC
  Administered 2012-02-28: 03:00:00
  Filled 2012-02-28: qty 1

## 2012-02-28 MED ORDER — DIPHENHYDRAMINE HCL 50 MG/ML IJ SOLN
12.5000 mg | Freq: Once | INTRAMUSCULAR | Status: AC
  Administered 2012-02-28: 12.5 mg via INTRAVENOUS

## 2012-02-28 MED ORDER — SODIUM CHLORIDE 0.9 % IV SOLN
INTRAVENOUS | Status: DC
  Administered 2012-02-29: 07:00:00 via INTRAVENOUS

## 2012-02-28 MED ORDER — HYDRALAZINE HCL 20 MG/ML IJ SOLN
5.0000 mg | INTRAMUSCULAR | Status: DC | PRN
  Administered 2012-02-28: 5 mg via INTRAVENOUS
  Filled 2012-02-28: qty 1

## 2012-02-28 MED ORDER — LISINOPRIL 10 MG PO TABS
10.0000 mg | ORAL_TABLET | Freq: Every day | ORAL | Status: DC
  Administered 2012-02-28 – 2012-03-01 (×3): 10 mg via ORAL
  Filled 2012-02-28 (×3): qty 1

## 2012-02-28 MED ORDER — INSULIN REGULAR BOLUS VIA INFUSION
0.0000 [IU] | Freq: Three times a day (TID) | INTRAVENOUS | Status: DC
  Filled 2012-02-28: qty 10

## 2012-02-28 MED ORDER — POTASSIUM CHLORIDE 20 MEQ/15ML (10%) PO LIQD
ORAL | Status: AC
  Filled 2012-02-28: qty 30

## 2012-02-28 MED ORDER — INSULIN ASPART 100 UNIT/ML ~~LOC~~ SOLN
0.0000 [IU] | Freq: Every day | SUBCUTANEOUS | Status: DC
  Administered 2012-02-28: 4 [IU] via SUBCUTANEOUS

## 2012-02-28 MED ORDER — DEXTROSE 50 % IV SOLN: 25.0000 mL | INTRAVENOUS | Status: DC | PRN

## 2012-02-28 NOTE — Progress Notes (Signed)
I reviewed Ms Wilber's chart, saw and examined her at bed side. She was admitted with a groin rash/polyuria/polydipsia, and is being treated for hyperosmolar, nonketotic hyperglycemia. Apparently, she has not been compliant with meds as she says she could not afford them after she lost her insurance. I have had an extensive discussion with her regarding need to be compliant. Today she says she may be reacting to cipro as she is itching after taking insulin. Her UA does not suggest UTI. Unfortunately, I could not examine the rash due to the nystatin ointment. I have taken her off cipro, and will continue ivf, insulin drip, await Hba1c. If Hba1c>10, she will need an insulin combination at d/c, although she is not enthusiastic about insulin, otherwise she might d/c on metformin/glyburide?  Simbsio Zaylia Riolo,MD [phone]

## 2012-02-28 NOTE — Care Management Note (Addendum)
    Page 1 of 1   03/01/2012     3:14:42 PM   CARE MANAGEMENT NOTE 03/01/2012  Patient:  Madeline Simpson, Madeline Simpson   Account Number:  [id number]  Date Initiated:  02/28/2012  Documentation initiated by:  Junius Creamer  Subjective/Objective Assessment:   abd pain, hyperglycemia     Action/Plan:   lives w fam   Anticipated DC Date:  03/01/2012   Anticipated DC Plan:  HOME/SELF CARE      DC Planning Services  CM consult      Choice offered to / List presented to:             Status of service:  Completed, signed off Medicare Important Message given?   (If response is "NO", the following Medicare IM given date fields will be blank) Date Medicare IM given:   Date Additional Medicare IM given:    Discharge Disposition:  HOME/SELF CARE  Per UR Regulation:  Reviewed for med. necessity/level of care/duration of stay  If discussed at Long Length of Stay Meetings, dates discussed:    Comments:  03/01/12 15:13 Letha Cape RN, BSN [phone] patient dc to home today.  7/17 12n debbie dowell rn,bsn gave pt card that may help w copay and also pt assist forms for ins. pt has ins but copay for ins can be high. also gave her phne #'s that drug companies had for copay assist.  7/16 8:36a debbie dowell rn,bsn [phone] spoke w pt. she recently found new job and ins just started. she can get her meds now that has ins and job.

## 2012-02-28 NOTE — Progress Notes (Signed)
Patient admitted with hyperglycemia (glucose 569 mg/dl).  Per report, patient was only taking Metformin 1000 mg bid.  Was supposed to also be taking insulin, however, patient reports she lost her job and her health insurance and was not able to afford insulin at that time.  Not sure how long patient has been without insulin.    Noted patient was admitted to [facility] hospital in February of this year.  Per d/c summary, patient was discharged home on Lantus 15 units daily along with Metformin.  Patient was initially treated with IV fluids and IV insulin drip per GlucoStabilizer.  Has been on IV insulin drip since 2am today.  Patient has been eating meals (less than 50%), but has not received any IV insulin boluses to cover her carbohydrate intake.  As a result, we have not had four CBGs below 180 mg/dl consecutively.  Last BMET drawn this morning showed normal CO2 level.  Spoke with RN caring for this patient.  RN contacted Dr. Venetia Constable and attained transition orders to SQ insulin.  Patient to start on Lantus 12 units along with Moderate SSI.  Spoke with patient about the importance of good CBG control to prevent long-term and acute complications.  Explained to patient that we have drawn an A1c level and are awaiting the results.  Explained what an A1c measures and also explained to patient what her goal A1c is (<7% per ADA standards).  Patient told me she is able to give insulin to herself via vial and syringe method.  Currently has job and has Programmer, applications again.  Explained to patient that she may need to be d/c'd home on insulin pending her A1c results.    Because of her age, if her A1c is 9% or greater, recommend patient be d/c'd home on insulin.    Will continue to follow. Ambrose Finland RN, MSN, CDE Diabetes Coordinator Inpatient Diabetes Program [phone]
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
## 2012-02-29 DIAGNOSIS — E119 Type 2 diabetes mellitus without complications: Secondary | ICD-10-CM

## 2012-02-29 DIAGNOSIS — I10 Essential (primary) hypertension: Secondary | ICD-10-CM

## 2012-02-29 LAB — GLUCOSE, CAPILLARY
Glucose-Capillary: 251 mg/dL — ABNORMAL HIGH (ref 70–99)
Glucose-Capillary: 349 mg/dL — ABNORMAL HIGH (ref 70–99)
Glucose-Capillary: 197 mg/dL — ABNORMAL HIGH (ref 70–99)

## 2012-02-29 LAB — COMPREHENSIVE METABOLIC PANEL
ALT: 32 U/L (ref 0–35)
AST: 25 U/L (ref 0–37)
Calcium: 9.2 mg/dL (ref 8.4–10.5)
GFR calc Af Amer: >90 mL/min (ref >90)
Sodium: 138 mEq/L (ref 135–145)
Total Protein: 6.4 g/dL (ref 6.0–8.3)

## 2012-02-29 LAB — CBC
MCH: 29.9 pg (ref 26.0–34.0)
MCHC: 34.4 g/dL (ref 30.0–36.0)
Platelets: 345 10*3/uL (ref 150–400)

## 2012-02-29 MED ORDER — INSULIN ASPART 100 UNIT/ML ~~LOC~~ SOLN: 0.0000 [IU] | Freq: Every day | SUBCUTANEOUS | Status: DC

## 2012-02-29 MED ORDER — ASPIRIN 81 MG PO CHEW
CHEWABLE_TABLET | ORAL | Status: AC
  Filled 2012-02-29: qty 1

## 2012-02-29 MED ORDER — HYDROCHLOROTHIAZIDE 12.5 MG PO CAPS
12.5000 mg | ORAL_CAPSULE | Freq: Every day | ORAL | Status: DC
  Administered 2012-02-29 – 2012-03-01 (×2): 12.5 mg via ORAL
  Filled 2012-02-29 (×2): qty 1

## 2012-02-29 MED ORDER — INSULIN ASPART 100 UNIT/ML ~~LOC~~ SOLN
0.0000 [IU] | Freq: Three times a day (TID) | SUBCUTANEOUS | Status: DC
  Administered 2012-02-29: 11 [IU] via SUBCUTANEOUS
  Administered 2012-03-01: 7 [IU] via SUBCUTANEOUS
  Administered 2012-03-01: 11 [IU] via SUBCUTANEOUS

## 2012-02-29 MED ORDER — TRAMADOL HCL 50 MG PO TABS
50.0000 mg | ORAL_TABLET | Freq: Two times a day (BID) | ORAL | Status: DC | PRN
  Administered 2012-02-29: 50 mg via ORAL
  Filled 2012-02-29 (×2): qty 1

## 2012-02-29 MED ORDER — INSULIN NPH (HUMAN) (ISOPHANE) 100 UNIT/ML ~~LOC~~ SUSP
15.0000 [IU] | Freq: Two times a day (BID) | SUBCUTANEOUS | Status: DC
  Administered 2012-02-29 – 2012-03-01 (×2): 15 [IU] via SUBCUTANEOUS
  Filled 2012-02-29 (×2): qty 10

## 2012-02-29 MED ORDER — ASPIRIN EC 81 MG PO TBEC
81.0000 mg | DELAYED_RELEASE_TABLET | Freq: Every day | ORAL | Status: DC
  Administered 2012-02-29 – 2012-03-01 (×2): 81 mg via ORAL
  Filled 2012-02-29 (×2): qty 1

## 2012-02-29 NOTE — Progress Notes (Signed)
[facility] Progress Note Madeline Simpson   Madeline Simpson ZOX:[id number] DOB: [birth year] DOA: 02/27/2012 PCP: Sheila Oats, MD  Brief narrative: 51 year old female with history of diabetes who was previously on insulin but was stopped presenting with abdominal pain some dysuria and frequency polydipsia. Patient was found to have a sugar of more than 500.     Assessment/Plan:  Severely uncontrolled diabetes mellitus / nonketotic hyperosmolar state A1c found to be markedly elevated at 12.4 - CBGs are still not controlled with the 3 last levels greater than 250 and 2 of those greater than 300 - transition to NPH to satisfy financial concerns - up-titrate dose and follow CBG - already on ACE - add ASA  Hypertension Blood pressure is reasonably controlled at the present time  Hyperlipidemia LDL was favorable in February - unclear if she was on meds at that time - recheck fasting level in AM  Tobacco abuse Has been counseled on need to quite completely  Possible allergic reaction to ciprofloxacin Sx resolved after dosing stopped - no indication for further abx tx  Sciatic type pain Counseled on weight loss - trial of NSAID  Code Status: Full Family Communication: Directly with the patient at bedside Disposition Plan: transfer to medical bed - home when CBG better controlled  Consultants: None  Procedures: None  Antibiotics: None  HPI/Subjective: Pt c/o R buttock and thigh pain c/w sciatica.  No le weakness or loss of bowel or bladder control.  Denies f/c, sob, n/v, or abdom pain.     Objective: Blood pressure 135/87, pulse 93, temperature 98.4 F (36.9 C), temperature source Oral, resp. rate 16, height 5\' 7"  (1.702 m), weight 90.8 kg (200 lb 2.8 oz), SpO2 96.00%.  Intake/Output Summary (Last 24 hours) at 02/29/12 1421 Last data filed at 02/29/12 1400  Gross per 24 hour  Intake 2060.1 ml  Output      0 ml  Net 2060.1 ml      Exam: General: No acute respiratory distress Lungs: Clear to auscultation bilaterally without wheezes or crackles Cardiovascular: Regular rate and rhythm without murmur gallop or rub normal S1 and S2 Abdomen: overweight, nontender, nondistended, soft, bowel sounds positive, no rebound, no ascites, no appreciable mass Extremities: No significant cyanosis, clubbing, or edema bilateral lower extremities  Data Reviewed: Basic Metabolic Panel:  Lab 02/29/12 8119 02/28/12 0445 02/27/12 1727  NA 138 139 136  K 3.7 3.1* 4.1  CL 100 101 94*  CO2 23 24 24   GLUCOSE 231* 204* 569*  BUN 6 9 13   CREATININE 0.55 0.46* 0.72  CALCIUM 9.2 9.2 10.2  MG 1.9 -- --  PHOS 3.3 -- --   Liver Function Tests:  Lab 02/29/12 0604 02/28/12 0445 02/27/12 1727  AST 25 24 16   ALT 32 25 23  ALKPHOS 52 56 75  BILITOT 0.7 0.6 0.4  PROT 6.4 6.8 7.2  ALBUMIN 3.6 3.8 4.2   CBC:  Lab 02/29/12 0604 02/28/12 0445 02/27/12 1727  WBC 8.0 8.1 9.3  NEUTROABS -- -- 5.5  HGB 13.5 13.4 14.4  HCT 39.2 38.1 41.6  MCV 86.7 85.4 86.8  PLT 345 326 385   CBG:  Lab 02/29/12 1151 02/29/12 0814 02/29/12 0742 02/28/12 2129 02/28/12 1718  GLUCAP 251* 313* 362* 315* 128*    Recent Results (from the past 240 hour(s))  MRSA PCR SCREENING     Status: Normal   Collection Time   02/28/12  1:53 AM      Component  Value Range Status Comment   MRSA by PCR NEGATIVE  NEGATIVE Final      Studies:  Recent x-ray studies have been reviewed in detail by the Attending Physician  Scheduled Meds:  Reviewed in detail by the Attending Physician   Lonia Blood, MD [facility] Office  [phone] Pager [phone]  On-Call/Text Page:      [email or web address]      password [email or web address]  If 7PM-7AM, please contact night-coverage [email or web address] Password [email or web address] 02/29/2012, 2:21 PM   LOS: 2 days

## 2012-02-29 NOTE — Progress Notes (Signed)
Inpatient Diabetes Program Recommendations  AACE/ADA: New Consensus Statement on Inpatient Glycemic Control  Target Ranges:  Prepandial:   less than 140 mg/dL      Peak postprandial:   less than 180 mg/dL (1-2 hours)      Critically ill patients:  140 - 180 mg/dL  Pager:  [phone] Hours:  8 am-10pm   Reason for Visit: Elevated glucose:  231 mg/dL this am  Inpatient Diabetes Program Recommendations Insulin - Basal: Increase Lantus to 20 units daily Insulin - Meal Coverage: Add Novolog 4 units TID for meal coverage  Alfredia Client PhD, RN Diabetes Coordinator  Office:  [phone] Team Pager:  [phone]

## 2012-02-29 NOTE — Progress Notes (Signed)
Pt alert and oriented. Call bell within reach. Pt fall safety plan discussed and signed by pt and placed beside bathroom door. Skin intact.

## 2012-03-01 LAB — GLUCOSE, CAPILLARY: Glucose-Capillary: 255 mg/dL — ABNORMAL HIGH (ref 70–99)

## 2012-03-01 MED ORDER — INSULIN NPH (HUMAN) (ISOPHANE) 100 UNIT/ML ~~LOC~~ SUSP: 18.0000 [IU] | Freq: Two times a day (BID) | SUBCUTANEOUS | Status: DC

## 2012-03-01 MED ORDER — SIMVASTATIN 10 MG PO TABS: 10.0000 mg | ORAL_TABLET | Freq: Every day | ORAL | Status: DC

## 2012-03-01 MED ORDER — SIMVASTATIN 10 MG PO TABS
10.0000 mg | ORAL_TABLET | Freq: Every day | ORAL | Status: DC
  Filled 2012-03-01: qty 1

## 2012-03-01 MED ORDER — LISINOPRIL 10 MG PO TABS: 10.0000 mg | ORAL_TABLET | Freq: Every day | ORAL | Status: DC

## 2012-03-01 MED ORDER — INSULIN ASPART 100 UNIT/ML ~~LOC~~ SOLN
4.0000 [IU] | Freq: Three times a day (TID) | SUBCUTANEOUS | Status: DC
  Administered 2012-03-01: 4 [IU] via SUBCUTANEOUS

## 2012-03-01 MED ORDER — INSULIN ASPART 100 UNIT/ML ~~LOC~~ SOLN: 4.0000 [IU] | Freq: Three times a day (TID) | SUBCUTANEOUS | Status: DC

## 2012-03-01 MED ORDER — HYDROCHLOROTHIAZIDE 12.5 MG PO CAPS: 12.5000 mg | ORAL_CAPSULE | Freq: Every day | ORAL | Status: DC

## 2012-03-01 MED ORDER — ASPIRIN 81 MG PO TBEC: 81.0000 mg | DELAYED_RELEASE_TABLET | Freq: Every day | ORAL | Status: DC

## 2012-03-01 MED ORDER — METFORMIN HCL 500 MG PO TABS
1000.0000 mg | ORAL_TABLET | Freq: Two times a day (BID) | ORAL | Status: DC
  Filled 2012-03-01 (×2): qty 2

## 2012-03-01 NOTE — Progress Notes (Signed)
Inpatient Diabetes Program Recommendations  AACE/ADA: New Consensus Statement on Inpatient Glycemic Control (2009)  Target Ranges:  Prepandial:   less than 140 mg/dL      Peak postprandial:   less than 180 mg/dL (1-2 hours)      Critically ill patients:  140 - 180 mg/dL   Reason for Visit: Hyperglycemia and change in insulin regimen.  Inpatient Diabetes Program Recommendations Insulin - Basal: If to use NPH by itself, Please change pm dose of NPH to HS rather than before supper.  Might also consider changing to the Novolin 70/30 to include meal coverage which would translate to 24 units 70/30 twice a day, am and ac hs. Insulin - Meal Coverage: [id number]   Note: Thank you, Lenor Coffin, RN, CNS, Diabetes Coordinator [phone])
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
## 2012-03-01 NOTE — Progress Notes (Signed)
Madeline Simpson to be D/C'd Home per MD order.  Discussed with the patient the After Visit Summary and all questions fully answered. All IV's discontinued with no bleeding noted. All belongings returned to patient for patient to take home.   Last Vital Signs:  Blood pressure 154/96, pulse 96, temperature 96 F (35.6 C), temperature source Oral, resp. rate 18, height 5\' 7"  (1.702 m), weight 90.8 kg (200 lb 2.8 oz), SpO2 100.00%.  Susann Givens, RN, [facility] 03/01/2012 3:10 PM

## 2012-04-09 ENCOUNTER — Encounter (HOSPITAL_COMMUNITY): Payer: Self-pay | Admitting: Cardiology

## 2012-11-29 ENCOUNTER — Emergency Department (HOSPITAL_COMMUNITY): Payer: BC Managed Care – PPO

## 2012-11-29 ENCOUNTER — Encounter (HOSPITAL_COMMUNITY): Payer: Self-pay | Admitting: *Deleted

## 2012-11-29 ENCOUNTER — Inpatient Hospital Stay (HOSPITAL_COMMUNITY)
Admission: EM | Admit: 2012-11-29 | Discharge: 2012-12-02 | DRG: 320 | Disposition: A | Discharge status: Home / Self Care | Payer: BC Managed Care – PPO | Attending: Family Medicine | Admitting: Family Medicine

## 2012-11-29 DIAGNOSIS — Z885 Allergy status to narcotic agent status: Secondary | ICD-10-CM

## 2012-11-29 DIAGNOSIS — N10 Acute tubulo-interstitial nephritis: Principal | ICD-10-CM | POA: Diagnosis present

## 2012-11-29 DIAGNOSIS — E119 Type 2 diabetes mellitus without complications: Secondary | ICD-10-CM | POA: Diagnosis present

## 2012-11-29 DIAGNOSIS — E669 Obesity, unspecified: Secondary | ICD-10-CM | POA: Diagnosis present

## 2012-11-29 DIAGNOSIS — E785 Hyperlipidemia, unspecified: Secondary | ICD-10-CM | POA: Diagnosis present

## 2012-11-29 DIAGNOSIS — Z8249 Family history of ischemic heart disease and other diseases of the circulatory system: Secondary | ICD-10-CM

## 2012-11-29 DIAGNOSIS — N12 Tubulo-interstitial nephritis, not specified as acute or chronic: Secondary | ICD-10-CM

## 2012-11-29 DIAGNOSIS — Z9089 Acquired absence of other organs: Secondary | ICD-10-CM

## 2012-11-29 DIAGNOSIS — Z91013 Allergy to seafood: Secondary | ICD-10-CM

## 2012-11-29 DIAGNOSIS — Z886 Allergy status to analgesic agent status: Secondary | ICD-10-CM

## 2012-11-29 DIAGNOSIS — Z90710 Acquired absence of both cervix and uterus: Secondary | ICD-10-CM

## 2012-11-29 DIAGNOSIS — Z87891 Personal history of nicotine dependence: Secondary | ICD-10-CM

## 2012-11-29 DIAGNOSIS — R7402 Elevation of levels of lactic acid dehydrogenase (LDH): Secondary | ICD-10-CM | POA: Diagnosis present

## 2012-11-29 DIAGNOSIS — Z880 Allergy status to penicillin: Secondary | ICD-10-CM

## 2012-11-29 DIAGNOSIS — R7401 Elevation of levels of liver transaminase levels: Secondary | ICD-10-CM | POA: Diagnosis present

## 2012-11-29 DIAGNOSIS — Z7982 Long term (current) use of aspirin: Secondary | ICD-10-CM

## 2012-11-29 DIAGNOSIS — K7689 Other specified diseases of liver: Secondary | ICD-10-CM | POA: Diagnosis present

## 2012-11-29 DIAGNOSIS — K429 Umbilical hernia without obstruction or gangrene: Secondary | ICD-10-CM | POA: Diagnosis present

## 2012-11-29 DIAGNOSIS — E1165 Type 2 diabetes mellitus with hyperglycemia: Secondary | ICD-10-CM | POA: Diagnosis present

## 2012-11-29 DIAGNOSIS — IMO0002 Reserved for concepts with insufficient information to code with codable children: Secondary | ICD-10-CM | POA: Diagnosis present

## 2012-11-29 DIAGNOSIS — Z6832 Body mass index (BMI) 32.0-32.9, adult: Secondary | ICD-10-CM

## 2012-11-29 DIAGNOSIS — Z794 Long term (current) use of insulin: Secondary | ICD-10-CM

## 2012-11-29 DIAGNOSIS — I10 Essential (primary) hypertension: Secondary | ICD-10-CM | POA: Diagnosis present

## 2012-11-29 LAB — LIPASE, BLOOD: Lipase: 32 U/L (ref 11–59)

## 2012-11-29 LAB — URINALYSIS, ROUTINE W REFLEX MICROSCOPIC
Glucose, UA: NEGATIVE mg/dL
Specific Gravity, Urine: >1.03 — ABNORMAL HIGH (ref 1.005–1.030)

## 2012-11-29 LAB — COMPREHENSIVE METABOLIC PANEL
AST: 15 U/L (ref 0–37)
Albumin: 3.5 g/dL (ref 3.5–5.2)
BUN: 12 mg/dL (ref 6–23)
Calcium: 9.5 mg/dL (ref 8.4–10.5)
Chloride: 94 mEq/L — ABNORMAL LOW (ref 96–112)
Creatinine, Ser: 0.83 mg/dL (ref 0.50–1.10)
GFR calc non Af Amer: 81 mL/min — ABNORMAL LOW (ref >90)
Total Bilirubin: 1 mg/dL (ref 0.3–1.2)

## 2012-11-29 LAB — CBC WITH DIFFERENTIAL/PLATELET
Hemoglobin: 12.2 g/dL (ref 12.0–15.0)
Lymphocytes Relative: 22 % (ref 12–46)
Lymphs Abs: 2.6 10*3/uL (ref 0.7–4.0)
Monocytes Relative: 11 % (ref 3–12)
Neutrophils Relative %: 67 % (ref 43–77)
Platelets: 303 10*3/uL (ref 150–400)
RBC: 4 MIL/uL (ref 3.87–5.11)
WBC: 11.5 10*3/uL — ABNORMAL HIGH (ref 4.0–10.5)

## 2012-11-29 LAB — WET PREP, GENITAL
Clue Cells Wet Prep HPF POC: NONE SEEN
Trich, Wet Prep: NONE SEEN
Yeast Wet Prep HPF POC: NONE SEEN

## 2012-11-29 MED ORDER — SODIUM CHLORIDE 0.9 % IV BOLUS (SEPSIS)
1000.0000 mL | Freq: Once | INTRAVENOUS | Status: AC
  Administered 2012-11-29: 1000 mL via INTRAVENOUS

## 2012-11-29 MED ORDER — ONDANSETRON HCL 4 MG/2ML IJ SOLN
4.0000 mg | Freq: Once | INTRAMUSCULAR | Status: AC
  Administered 2012-11-29: 4 mg via INTRAVENOUS
  Filled 2012-11-29: qty 2

## 2012-11-29 MED ORDER — CIPROFLOXACIN IN D5W 400 MG/200ML IV SOLN
400.0000 mg | Freq: Once | INTRAVENOUS | Status: AC
  Administered 2012-11-29: 400 mg via INTRAVENOUS
  Filled 2012-11-29: qty 200

## 2012-11-29 MED ORDER — IOHEXOL 300 MG/ML  SOLN
100.0000 mL | Freq: Once | INTRAMUSCULAR | Status: AC | PRN
  Administered 2012-11-29: 100 mL via INTRAVENOUS

## 2012-11-29 MED ORDER — IOHEXOL 300 MG/ML  SOLN
50.0000 mL | Freq: Once | INTRAMUSCULAR | Status: AC | PRN
  Administered 2012-11-29: 50 mL via ORAL

## 2012-11-29 MED ORDER — HYDROMORPHONE HCL PF 1 MG/ML IJ SOLN
1.0000 mg | Freq: Once | INTRAMUSCULAR | Status: AC
  Administered 2012-11-29: 1 mg via INTRAVENOUS
  Filled 2012-11-29: qty 1

## 2012-11-29 MED ORDER — ACETAMINOPHEN 500 MG PO TABS
ORAL_TABLET | ORAL | Status: AC
  Administered 2012-11-29: 1000 mg
  Filled 2012-11-29: qty 2

## 2012-11-29 NOTE — ED Notes (Signed)
Has completed drinking 2nd bottle of contrast

## 2012-11-29 NOTE — ED Provider Notes (Signed)
History    This chart was scribed for Madeline Hutching, MD by Madeline Simpson, ED Scribe. This patient was seen in room APA18/APA18 and the patient's care was started at 2121.   CSN: [id number]  Arrival date & time 11/29/12  1940   First MD Initiated Contact with Patient 11/29/12 2121      Chief Complaint  Patient presents with  . Abdominal Pain     The history is provided by the patient. No language interpreter was used.   DONIELLE Madeline Simpson is a 52 y.o. female who presents to the Emergency Department complaining of gradual onset, lower abdominal pain which radiates to her left flank which began a few days ago.   She reports she had chill and a fever (110) a few days which resolved itself, but the abdominal pain has persisted since. She reports associated bloating, tenderness, chills, fever, hematuria, hematochezia, and diarrhea. She states that the diarrhea and hematochezia are not new and she is currently seeing a GI doctor to solve the problems. She denies dysuria, vaginal discharge or any other pain. She has been eating, but can only keep down yogurt. She tried Tylenol with mild relief.   Her last menstrual cycle was 1999 due to partial hysterectomy. She also has had hand surgery. She works for [employer]  Past Medical History  Diagnosis Date  . Diabetes mellitus   . Hypertension   . Hyperlipidemia   . H/O tobacco use, presenting hazards to health   . Complication of anesthesia     "came out fighting, put back under aneth"    Past Surgical History  Procedure Laterality Date  . Cholecystectomy    . Abdominal hysterectomy      partial  . Knee surgery      right  . Hand surgery      Family History  Problem Relation Age of Onset  . Cerebral aneurysm Mother 48  . Hypertension Mother     History  Substance Use Topics  . Smoking status: Former Smoker -- 1.00 packs/day for 25 years    Types: Cigarettes    Quit date: 04/09/2011  . Smokeless tobacco: Not on file  . Alcohol  Use: No     Review of Systems A complete 10 system review of systems was obtained and all systems are negative except as noted in the HPI and PMH.   Allergies  Penicillins; Codeine; and Naproxen  Home Medications   Current Outpatient Rx  Name  Route  Sig  Dispense  Refill  . aspirin EC 81 MG EC tablet   Oral   Take 1 tablet (81 mg total) by mouth daily.         . hydrochlorothiazide (MICROZIDE) 12.5 MG capsule   Oral   Take 1 capsule (12.5 mg total) by mouth daily.   30 capsule   0   . insulin aspart (NOVOLOG) 100 UNIT/ML injection   Subcutaneous   Inject 4 Units into the skin 3 (three) times daily with meals.   1 vial   0   . insulin NPH (HUMULIN N,NOVOLIN N) 100 UNIT/ML injection   Subcutaneous   Inject 18 Units into the skin 2 (two) times daily before a meal.   1 vial   0   . lisinopril (PRINIVIL,ZESTRIL) 10 MG tablet   Oral   Take 1 tablet (10 mg total) by mouth daily.   30 tablet   0   . metFORMIN (GLUCOPHAGE) 500 MG tablet   Oral  Take 1,000 mg by mouth 2 (two) times daily with a meal.         . Multiple Vitamin (MULITIVITAMIN WITH MINERALS) TABS   Oral   Take 1 tablet by mouth daily.         . naphazoline-pheniramine (NAPHCON-A) 0.025-0.3 % ophthalmic solution   Both Eyes   Place 1 drop into both eyes 4 (four) times daily as needed. For red eyes         . simvastatin (ZOCOR) 10 MG tablet   Oral   Take 1 tablet (10 mg total) by mouth at bedtime.   30 tablet   0     BP 124/78  Pulse 109  Temp(Src) 100.5 F (38.1 C) (Oral)  Resp 20  Ht 5\' 7"  (1.702 m)  Wt 206 lb (93.441 kg)  BMI 32.26 kg/m2  SpO2 98%  Physical Exam  Nursing note and vitals reviewed. Constitutional: She is oriented to person, place, and time. She appears well-developed and well-nourished.  HENT:  Head: Normocephalic and atraumatic.  Eyes: Conjunctivae and EOM are normal. Pupils are equal, round, and reactive to light.  Neck: Normal range of motion. Neck supple.   Cardiovascular: Normal rate, regular rhythm and normal heart sounds.   Pulmonary/Chest: Effort normal and breath sounds normal.  Abdominal: Soft. Bowel sounds are normal.  Protruding abdomen.  Genitourinary:  Normal external genitalia. No cervix identified. Adnexa nontender.    Left flank tenderness  Musculoskeletal: Normal range of motion. She exhibits tenderness.  Tenderness to left flank and entire lower abdomen.  Neurological: She is alert and oriented to person, place, and time.  Skin: Skin is warm and dry.  Psychiatric: She has a normal mood and affect.    ED Course  Procedures (including critical care time) DIAGNOSTIC STUDIES: Oxygen Saturation is 98% on room air, normal by my interpretation.    COORDINATION OF CARE: 9:40 PM Discussed treatment plan which includes CT scan and pain medication with pt at bedside and pt agreed to plan. Discussed possibility of admittance to hospital.   Results for orders placed during the hospital encounter of 11/29/12  URINALYSIS, ROUTINE W REFLEX MICROSCOPIC      Result Value Range   Color, Urine YELLOW  YELLOW   APPearance CLOUDY (*) CLEAR   Specific Gravity, Urine >1.030 (*) 1.005 - 1.030   pH 5.5  5.0 - 8.0   Glucose, UA NEGATIVE  NEGATIVE mg/dL   Hgb urine dipstick LARGE (*) NEGATIVE   Bilirubin Urine NEGATIVE  NEGATIVE   Ketones, ur TRACE (*) NEGATIVE mg/dL   Protein, ur >478 (*) NEGATIVE mg/dL   Urobilinogen, UA 1.0  0.0 - 1.0 mg/dL   Nitrite POSITIVE (*) NEGATIVE   Leukocytes, UA SMALL (*) NEGATIVE  COMPREHENSIVE METABOLIC PANEL      Result Value Range   Sodium 133 (*) 135 - 145 mEq/L   Potassium 3.3 (*) 3.5 - 5.1 mEq/L   Chloride 94 (*) 96 - 112 mEq/L   CO2 26  19 - 32 mEq/L   Glucose, Bld 227 (*) 70 - 99 mg/dL   BUN 12  6 - 23 mg/dL   Creatinine, Ser 2.95  0.50 - 1.10 mg/dL   Calcium 9.5  8.4 - 62.1 mg/dL   Total Protein 7.7  6.0 - 8.3 g/dL   Albumin 3.5  3.5 - 5.2 g/dL   AST 15  0 - 37 U/L   ALT 17  0 - 35 U/L    Alkaline Phosphatase 57  39 - 117 U/L   Total Bilirubin 1.0  0.3 - 1.2 mg/dL   GFR calc non Af Amer 81 (*) >90 mL/min   GFR calc Af Amer >90  >90 mL/min  CBC WITH DIFFERENTIAL      Result Value Range   WBC 11.5 (*) 4.0 - 10.5 K/uL   RBC 4.00  3.87 - 5.11 MIL/uL   Hemoglobin 12.2  12.0 - 15.0 g/dL   HCT 16.1 (*) 09.6 - 04.5 %   MCV 86.0  78.0 - 100.0 fL   MCH 30.5  26.0 - 34.0 pg   MCHC 35.5  30.0 - 36.0 g/dL   RDW 40.9  81.1 - 91.4 %   Platelets 303  150 - 400 K/uL   Neutrophils Relative 67  43 - 77 %   Neutro Abs 7.6  1.7 - 7.7 K/uL   Lymphocytes Relative 22  12 - 46 %   Lymphs Abs 2.6  0.7 - 4.0 K/uL   Monocytes Relative 11  3 - 12 %   Monocytes Absolute 1.3 (*) 0.1 - 1.0 K/uL   Eosinophils Relative 0  0 - 5 %   Eosinophils Absolute 0.0  0.0 - 0.7 K/uL   Basophils Relative 0  0 - 1 %   Basophils Absolute 0.0  0.0 - 0.1 K/uL  LIPASE, BLOOD      Result Value Range   Lipase 32  11 - 59 U/L  URINE MICROSCOPIC-ADD ON      Result Value Range   Squamous Epithelial / LPF FEW (*) RARE   WBC, UA TOO NUMEROUS TO COUNT  <3 WBC/hpf   RBC / HPF 3-6  <3 RBC/hpf   Bacteria, UA MANY (*) RARE     Ct Abdomen Pelvis W Contrast  11/29/2012  *RADIOLOGY REPORT*  Clinical Data: Bilateral lower quadrant abdominal pain, radiating into the left flank.  Fever.  CT ABDOMEN AND PELVIS WITH CONTRAST  Technique:  Multidetector CT imaging of the abdomen and pelvis was performed following the standard protocol during bolus administration of intravenous contrast.  Contrast: 100 mL of Omnipaque 300 IV contrast  Comparison: Lumbar spine radiographs performed 09/26/2007  Findings: Mild bibasilar atelectasis is noted.  There is mild diffuse fatty infiltration within the liver, with mild peripheral sparing.  The liver and spleen are otherwise unremarkable in appearance.  The patient is status post cholecystectomy, with clips noted along the gallbladder fossa.  The pancreas and adrenal glands are unremarkable in  appearance.  Several vague areas of decreased enhancement are seen within the left kidney, compatible with multiple focal areas of pyelonephritis.  Minimal associated soft tissue stranding is seen. The right kidney is unremarkable in appearance.  There is no evidence of hydronephrosis.  No renal or ureteral stones are seen.  No free fluid is identified.  The small bowel is unremarkable in appearance.  The stomach is within normal limits.  No acute vascular abnormalities are seen.  Minimal calcification is noted along the bilateral internal iliac arteries.  A small wide-based umbilical hernia is noted, containing only fat.  The appendix is normal in caliber, without evidence for appendicitis.  Mild scattered diverticulosis is noted along the descending colon, without evidence of diverticulitis.  The colon is otherwise unremarkable in appearance.  The bladder is mildly distended and grossly unremarkable.  The patient is status post hysterectomy.  No suspicious adnexal masses are seen.  No inguinal lymphadenopathy is seen.  No acute osseous abnormalities are identified.  IMPRESSION:  1.  Several vague areas of decreased enhancement within the left kidney, compatible with multiple focal areas of pyelonephritis. Minimal associated soft tissue inflammation seen.  No evidence of hydronephrosis; no renal or ureteral stones seen. 2.  Small wide-based umbilical hernia noted, containing only fat. 3.  Mild scattered diverticulosis along the descending colon, without evidence of diverticulitis. 4.  Mild bibasilar atelectasis noted. 5.  Mild diffuse fatty infiltration within the liver.   Original Report Authenticated By: Tonia Ghent, M.D.    Labs Reviewed  URINALYSIS, ROUTINE W REFLEX MICROSCOPIC   No results found.   No diagnosis found.  CRITICAL CARE Performed by: Madeline Simpson  ?  Total critical care time: 30  Critical care time was exclusive of separately billable procedures and treating other  patients.  Critical care was necessary to treat or prevent imminent or life-threatening deterioration.  Critical care wIV CiproIV as time spent personally by me on the following activities: development of treatment plan with patient and/or surrogate as well as nursing, discussions with consultants, evaluation of patient's response to treatment, examination of patient, obtaining history from patient or surrogate, ordering and performing treatments and interventions, ordering and review of laboratory studies, ordering and review of radiographic studies, pulse oximetry and re-evaluation of patient's condition.  MDM  Patient's febrile c chills.  No acute abdomen. CT scan shows probable pyelonephritis in the left kidney. IV cipro initiated.  Admit to general medicine.   I personally performed the services described in this documentation, which was scribed in my presence. The recorded information has been reviewed and is accurate.         Madeline Hutching, MD 11/30/12 [phone]

## 2012-11-29 NOTE — ED Notes (Signed)
Returned from CT - [place] he[place].

## 2012-11-29 NOTE — ED Notes (Signed)
Set up for pelvic exam and then will start antibiotics - cannot access the patient at this time

## 2012-11-29 NOTE — ED Notes (Addendum)
Chills , fever for 2 days, abd and back pain,  Cough, Hematuria , urine darker than usual.  Feels abd is swollen.Vomiting,    Lt eye is tearing.

## 2012-11-29 NOTE — ED Notes (Signed)
Patient states she feels as if she needs to urinate but then does not have to go.  Pain across lower abdomen and lower back.  Encouraged to drink water and provide a urine specimen as soon as she can.  Denies any nausea or vomiting

## 2012-11-29 NOTE — ED Notes (Addendum)
MD informed patient states " the pain is unbearable."

## 2012-11-30 ENCOUNTER — Encounter (HOSPITAL_COMMUNITY): Payer: Self-pay | Admitting: Internal Medicine

## 2012-11-30 DIAGNOSIS — N10 Acute tubulo-interstitial nephritis: Principal | ICD-10-CM

## 2012-11-30 DIAGNOSIS — I10 Essential (primary) hypertension: Secondary | ICD-10-CM

## 2012-11-30 DIAGNOSIS — E785 Hyperlipidemia, unspecified: Secondary | ICD-10-CM

## 2012-11-30 DIAGNOSIS — E669 Obesity, unspecified: Secondary | ICD-10-CM | POA: Diagnosis present

## 2012-11-30 DIAGNOSIS — E119 Type 2 diabetes mellitus without complications: Secondary | ICD-10-CM

## 2012-11-30 LAB — CBC
HCT: 34.2 % — ABNORMAL LOW (ref 36.0–46.0)
Hemoglobin: 11.8 g/dL — ABNORMAL LOW (ref 12.0–15.0)
MCH: 29.8 pg (ref 26.0–34.0)
MCHC: 34.5 g/dL (ref 30.0–36.0)
RBC: 3.96 MIL/uL (ref 3.87–5.11)

## 2012-11-30 LAB — GLUCOSE, CAPILLARY: Glucose-Capillary: 233 mg/dL — ABNORMAL HIGH (ref 70–99)

## 2012-11-30 LAB — COMPREHENSIVE METABOLIC PANEL
Alkaline Phosphatase: 86 U/L (ref 39–117)
BUN: 8 mg/dL (ref 6–23)
CO2: 26 mEq/L (ref 19–32)
Calcium: 8.5 mg/dL (ref 8.4–10.5)
GFR calc Af Amer: >90 mL/min (ref >90)
GFR calc non Af Amer: >90 mL/min (ref >90)
Glucose, Bld: 249 mg/dL — ABNORMAL HIGH (ref 70–99)
Total Protein: 6.7 g/dL (ref 6.0–8.3)

## 2012-11-30 MED ORDER — SODIUM CHLORIDE 0.9 % IV SOLN
INTRAVENOUS | Status: AC
  Administered 2012-11-30: 150 mL/h via INTRAVENOUS

## 2012-11-30 MED ORDER — DEXTROSE 5 % IV SOLN
INTRAVENOUS | Status: AC
  Filled 2012-11-30: qty 1

## 2012-11-30 MED ORDER — LORATADINE 10 MG PO TABS
10.0000 mg | ORAL_TABLET | Freq: Every day | ORAL | Status: DC
  Administered 2012-11-30 – 2012-12-02 (×3): 10 mg via ORAL
  Filled 2012-11-30 (×3): qty 1

## 2012-11-30 MED ORDER — POTASSIUM CHLORIDE IN NACL 20-0.9 MEQ/L-% IV SOLN
INTRAVENOUS | Status: DC
  Administered 2012-11-30 (×2): via INTRAVENOUS

## 2012-11-30 MED ORDER — INSULIN GLARGINE 100 UNIT/ML ~~LOC~~ SOLN
30.0000 [IU] | Freq: Every day | SUBCUTANEOUS | Status: DC
  Administered 2012-11-30 – 2012-12-02 (×3): 30 [IU] via SUBCUTANEOUS
  Filled 2012-11-30 (×4): qty 0.3

## 2012-11-30 MED ORDER — ONDANSETRON HCL 4 MG/2ML IJ SOLN: 4.0000 mg | INTRAMUSCULAR | Status: DC | PRN

## 2012-11-30 MED ORDER — ENOXAPARIN SODIUM 40 MG/0.4ML ~~LOC~~ SOLN
40.0000 mg | SUBCUTANEOUS | Status: DC
  Administered 2012-11-30 – 2012-12-02 (×3): 40 mg via SUBCUTANEOUS
  Filled 2012-11-30 (×3): qty 0.4

## 2012-11-30 MED ORDER — METFORMIN HCL 500 MG PO TABS: 1000.0000 mg | ORAL_TABLET | Freq: Two times a day (BID) | ORAL | Status: DC

## 2012-11-30 MED ORDER — FAMOTIDINE 20 MG PO TABS
20.0000 mg | ORAL_TABLET | Freq: Two times a day (BID) | ORAL | Status: DC
  Administered 2012-11-30 – 2012-12-02 (×6): 20 mg via ORAL
  Filled 2012-11-30 (×6): qty 1

## 2012-11-30 MED ORDER — TRAMADOL HCL 50 MG PO TABS
50.0000 mg | ORAL_TABLET | Freq: Four times a day (QID) | ORAL | Status: DC | PRN
  Administered 2012-11-30 – 2012-12-01 (×3): 50 mg via ORAL
  Filled 2012-11-30 (×4): qty 1

## 2012-11-30 MED ORDER — INSULIN ASPART 100 UNIT/ML ~~LOC~~ SOLN
0.0000 [IU] | Freq: Three times a day (TID) | SUBCUTANEOUS | Status: DC
  Administered 2012-11-30: 8 [IU] via SUBCUTANEOUS
  Administered 2012-11-30 – 2012-12-01 (×4): 5 [IU] via SUBCUTANEOUS
  Administered 2012-12-01: 8 [IU] via SUBCUTANEOUS
  Administered 2012-12-02: 5 [IU] via SUBCUTANEOUS
  Administered 2012-12-02: 15 [IU] via SUBCUTANEOUS

## 2012-11-30 MED ORDER — SIMVASTATIN 10 MG PO TABS
10.0000 mg | ORAL_TABLET | Freq: Every day | ORAL | Status: DC
  Administered 2012-11-30 – 2012-12-01 (×2): 10 mg via ORAL
  Filled 2012-11-30 (×2): qty 1

## 2012-11-30 MED ORDER — INSULIN ASPART 100 UNIT/ML ~~LOC~~ SOLN
3.0000 [IU] | Freq: Three times a day (TID) | SUBCUTANEOUS | Status: DC
  Administered 2012-11-30 – 2012-12-02 (×7): 3 [IU] via SUBCUTANEOUS

## 2012-11-30 MED ORDER — DEXTROSE 5 % IV SOLN
1.0000 g | Freq: Three times a day (TID) | INTRAVENOUS | Status: DC
  Administered 2012-11-30 – 2012-12-01 (×5): 1 g via INTRAVENOUS
  Filled 2012-11-30 (×11): qty 1

## 2012-11-30 MED ORDER — INSULIN ASPART 100 UNIT/ML ~~LOC~~ SOLN
0.0000 [IU] | Freq: Every day | SUBCUTANEOUS | Status: DC
  Administered 2012-11-30 – 2012-12-01 (×2): 3 [IU] via SUBCUTANEOUS

## 2012-11-30 MED ORDER — LISINOPRIL 10 MG PO TABS
10.0000 mg | ORAL_TABLET | Freq: Every day | ORAL | Status: DC
  Administered 2012-11-30 – 2012-12-02 (×3): 10 mg via ORAL
  Filled 2012-11-30 (×3): qty 1

## 2012-11-30 MED ORDER — ACETAMINOPHEN 325 MG PO TABS
650.0000 mg | ORAL_TABLET | ORAL | Status: DC | PRN
  Administered 2012-11-30 – 2012-12-01 (×2): 650 mg via ORAL
  Filled 2012-11-30 (×2): qty 2

## 2012-11-30 MED ORDER — NAPHAZOLINE-PHENIRAMINE 0.025-0.3 % OP SOLN
1.0000 [drp] | Freq: Four times a day (QID) | OPHTHALMIC | Status: DC | PRN
  Filled 2012-11-30: qty 15

## 2012-11-30 MED ORDER — POTASSIUM CHLORIDE CRYS ER 20 MEQ PO TBCR
40.0000 meq | EXTENDED_RELEASE_TABLET | Freq: Once | ORAL | Status: AC
  Administered 2012-11-30: 40 meq via ORAL
  Filled 2012-11-30: qty 2

## 2012-11-30 MED ORDER — GLUCERNA SHAKE PO LIQD
237.0000 mL | Freq: Two times a day (BID) | ORAL | Status: DC
  Administered 2012-11-30 – 2012-12-02 (×5): 237 mL via ORAL

## 2012-11-30 MED ORDER — TRAZODONE HCL 50 MG PO TABS: 50.0000 mg | ORAL_TABLET | Freq: Every evening | ORAL | Status: DC | PRN

## 2012-11-30 MED ORDER — ASPIRIN EC 81 MG PO TBEC
81.0000 mg | DELAYED_RELEASE_TABLET | Freq: Every day | ORAL | Status: DC
  Administered 2012-11-30 – 2012-12-02 (×3): 81 mg via ORAL
  Filled 2012-11-30 (×3): qty 1

## 2012-11-30 MED ORDER — HYDROMORPHONE HCL PF 1 MG/ML IJ SOLN
0.5000 mg | INTRAMUSCULAR | Status: DC | PRN
  Administered 2012-11-30 – 2012-12-01 (×2): 0.5 mg via INTRAVENOUS
  Filled 2012-11-30 (×2): qty 1

## 2012-11-30 NOTE — Progress Notes (Signed)
[facility] PROGRESS NOTE  Madeline Simpson ZOX:[id number] DOB: April 24, 1961 DOA: 11/29/2012 PCP: Default, Provider, MD Deboraha Sprang at [facility]  Assessment/Plan: 1. Acute left-sided pyelonephritis: Symptomatically improved. Afebrile since last night. Leukocytosis resolved. Continue Azactam pending culture. Anaphylaxis with penicillin. Rash with Cipro. 2. Elevated transaminases: Alkaline phosphatase and total bilirubin normal. Likely secondary to fatty liver as seen on CT. Recheck in the morning. 3. Diabetes mellitus: Fair control. Resume basal insulin. Resume metformin on discharge. 4. Hypertension: Well controlled. 5. Obesity 6. Small umbilical hernia without complication: Followup as an outpatient.  Code Status:  full code  Family Communication: none present  Disposition Plan:  home when improved   Brendia Sacks, MD  [facility]  Pager [phone] If 7PM-7AM, please contact night-coverage at [email or web address], password [email or web address] 11/30/2012, 9:17 AM  LOS: 1 day   Brief narrative: 52 year old woman presented with nausea, vomiting, hematuria, the abdominal pain, left-sided flank pain. Urinalysis suggested infection CT scan suggested multifocal left-sided pyelonephritis.  Consultants:    Procedures:    Antibiotics:  Azactam 4/18 >>  HPI/Subjective: Afebrile, vital signs stable. Feels better. Left flank pain persists but abdominal distention has decreased. Tolerated diet this morning.  Objective: Filed Vitals:   11/30/12 0047 11/30/12 0122 11/30/12 0513 11/30/12 0758  BP: 119/86 125/82 138/87   Pulse: 92 91 86 90  Temp: 98.6 F (37 C) 98.1 F (36.7 C) 98.1 F (36.7 C)   TempSrc: Oral Oral Oral   Resp: 20 20 17    Height:  5\' 7"  (1.702 m)    Weight:  96 kg (211 lb 10.3 oz)    SpO2: 94% 91% 91% 95%    Intake/Output Summary (Last 24 hours) at 11/30/12 0917 Last data filed at 11/30/12 0848  Gross per 24 hour  Intake    240 ml  Output      0 ml  Net    240 ml   Filed  Weights   11/29/12 2000 11/30/12 0122  Weight: 93.441 kg (206 lb) 96 kg (211 lb 10.3 oz)    Exam:  General:  Appears calm and comfortable. Smiles. Moves about without difficulty. Cardiovascular: RRR, no m/r/g. No LE edema. Respiratory: CTA bilaterally, no w/r/r. Normal respiratory effort. Abdomen: soft, ntnd; left flank with minimal pain. Musculoskeletal: grossly normal tone BUE/BLE Psychiatric: grossly normal mood and affect, speech fluent and appropriate  Data Reviewed: Basic Metabolic Panel:  Recent Labs Lab 11/29/12 2143 11/30/12 0547  NA 133* 137  K 3.3* 3.9  CL 94* 100  CO2 26 26  GLUCOSE 227* 249*  BUN 12 8  CREATININE 0.83 0.63  CALCIUM 9.5 8.5   Liver Function Tests:  Recent Labs Lab 11/29/12 2143 11/30/12 0547  AST 15 146*  ALT 17 67*  ALKPHOS 57 86  BILITOT 1.0 0.9  PROT 7.7 6.7  ALBUMIN 3.5 3.1*    Recent Labs Lab 11/29/12 2143  LIPASE 32   CBC:  Recent Labs Lab 11/29/12 2143 11/30/12 0547  WBC 11.5* 8.0  NEUTROABS 7.6  --   HGB 12.2 11.8*  HCT 34.4* 34.2*  MCV 86.0 86.4  PLT 303 297   CBG:  Recent Labs Lab 11/30/12 0134 11/30/12 0724  GLUCAP 227* 233*    Recent Results (from the past 240 hour(s))  WET PREP, GENITAL     Status: Abnormal   Collection Time    11/29/12 11:47 PM      Result Value Range Status   Yeast Wet Prep HPF POC NONE SEEN  NONE SEEN Final  Trich, Wet Prep NONE SEEN  NONE SEEN Final   Clue Cells Wet Prep HPF POC NONE SEEN  NONE SEEN Final   WBC, Wet Prep HPF POC FEW (*) NONE SEEN Final     Studies: Ct Abdomen Pelvis W Contrast  11/29/2012  *RADIOLOGY REPORT*  Clinical Data: Bilateral lower quadrant abdominal pain, radiating into the left flank.  Fever.  CT ABDOMEN AND PELVIS WITH CONTRAST  Technique:  Multidetector CT imaging of the abdomen and pelvis was performed following the standard protocol during bolus administration of intravenous contrast.  Contrast: 100 mL of Omnipaque 300 IV contrast   Comparison: Lumbar spine radiographs performed 09/26/2007  Findings: Mild bibasilar atelectasis is noted.  There is mild diffuse fatty infiltration within the liver, with mild peripheral sparing.  The liver and spleen are otherwise unremarkable in appearance.  The patient is status post cholecystectomy, with clips noted along the gallbladder fossa.  The pancreas and adrenal glands are unremarkable in appearance.  Several vague areas of decreased enhancement are seen within the left kidney, compatible with multiple focal areas of pyelonephritis.  Minimal associated soft tissue stranding is seen. The right kidney is unremarkable in appearance.  There is no evidence of hydronephrosis.  No renal or ureteral stones are seen.  No free fluid is identified.  The small bowel is unremarkable in appearance.  The stomach is within normal limits.  No acute vascular abnormalities are seen.  Minimal calcification is noted along the bilateral internal iliac arteries.  A small wide-based umbilical hernia is noted, containing only fat.  The appendix is normal in caliber, without evidence for appendicitis.  Mild scattered diverticulosis is noted along the descending colon, without evidence of diverticulitis.  The colon is otherwise unremarkable in appearance.  The bladder is mildly distended and grossly unremarkable.  The patient is status post hysterectomy.  No suspicious adnexal masses are seen.  No inguinal lymphadenopathy is seen.  No acute osseous abnormalities are identified.  IMPRESSION:  1.  Several vague areas of decreased enhancement within the left kidney, compatible with multiple focal areas of pyelonephritis. Minimal associated soft tissue inflammation seen.  No evidence of hydronephrosis; no renal or ureteral stones seen. 2.  Small wide-based umbilical hernia noted, containing only fat. 3.  Mild scattered diverticulosis along the descending colon, without evidence of diverticulitis. 4.  Mild bibasilar atelectasis noted.  5.  Mild diffuse fatty infiltration within the liver.   Original Report Authenticated By: Tonia Ghent, M.D.     Scheduled Meds: . sodium chloride   Intravenous STAT  . aspirin EC  81 mg Oral Daily  . aztreonam  1 g Intravenous Q8H  . enoxaparin (LOVENOX) injection  40 mg Subcutaneous Q24H  . famotidine  20 mg Oral BID  . feeding supplement  237 mL Oral BID  . insulin aspart  0-15 Units Subcutaneous TID WC  . insulin aspart  0-5 Units Subcutaneous QHS  . lisinopril  10 mg Oral Daily  . loratadine  10 mg Oral Daily  . metFORMIN  1,000 mg Oral BID WC  . simvastatin  10 mg Oral QHS   Continuous Infusions: . 0.9 % NaCl with KCl 20 mEq / L 100 mL/hr at 11/30/12 1610    Principal Problem:   Acute pyelonephritis Active Problems:   HTN (hypertension)   DM (diabetes mellitus), type 2   Hyperlipidemia   Obesity, unspecified     Brendia Sacks, MD  [facility] Pager [phone] If 7PM-7AM, please contact night-coverage at [email or web address], password  [email or web address] 11/30/2012, 9:17 AM  LOS: 1 day   Time spent: 20 minutes

## 2012-11-30 NOTE — Progress Notes (Signed)
UR Chart Review Completed  

## 2012-11-30 NOTE — Progress Notes (Signed)
ANTIBIOTIC CONSULT NOTE - INITIAL  Pharmacy Consult for Aztreonam Indication: pyelonephritis  Allergies  Allergen Reactions  . Penicillins Anaphylaxis  . Apple Itching and Swelling  . Codeine Hives, Itching and Other (See Comments)    delirioius  . Naproxen Hives and Itching  . Shellfish Allergy Itching  . Ciprofloxacin Itching and Rash   Patient Measurements: Height: 5\' 7"  (170.2 cm) Weight: 211 lb 10.3 oz (96 kg) IBW/kg (Calculated) : 61.6  Vital Signs: Temp: 98.1 F (36.7 C) (04/18 0513) Temp src: Oral (04/18 0513) BP: 138/87 mmHg (04/18 0513) Pulse Rate: 90 (04/18 0758) Intake/Output from previous day:   Intake/Output from this shift: Total I/O In: 240 [P.O.:240] Out: -   Labs:  Recent Labs  11/29/12 2143 11/30/12 0547  WBC 11.5* 8.0  HGB 12.2 11.8*  PLT 303 297  CREATININE 0.83 0.63   Estimated Creatinine Clearance: 101.3 ml/min (by C-G formula based on Cr of 0.63). No results found for this basename: VANCOTROUGH, VANCOPEAK, VANCORANDOM, GENTTROUGH, GENTPEAK, GENTRANDOM, TOBRATROUGH, TOBRAPEAK, TOBRARND, AMIKACINPEAK, AMIKACINTROU, AMIKACIN,  in the last 72 hours   Microbiology: Recent Results (from the past 720 hour(s))  WET PREP, GENITAL     Status: Abnormal   Collection Time    11/29/12 11:47 PM      Result Value Range Status   Yeast Wet Prep HPF POC NONE SEEN  NONE SEEN Final   Trich, Wet Prep NONE SEEN  NONE SEEN Final   Clue Cells Wet Prep HPF POC NONE SEEN  NONE SEEN Final   WBC, Wet Prep HPF POC FEW (*) NONE SEEN Final   Medical History: Past Medical History  Diagnosis Date  . Diabetes mellitus   . Hypertension   . Hyperlipidemia   . H/O tobacco use, presenting hazards to health   . Complication of anesthesia     "came out fighting, put back under aneth"   Medications:  Scheduled:  . sodium chloride   Intravenous STAT  . [COMPLETED] acetaminophen      . aspirin EC  81 mg Oral Daily  . aztreonam  1 g Intravenous Q8H  . [COMPLETED]  ciprofloxacin  400 mg Intravenous Once  . enoxaparin (LOVENOX) injection  40 mg Subcutaneous Q24H  . famotidine  20 mg Oral BID  . feeding supplement  237 mL Oral BID  . [COMPLETED]  HYDROmorphone (DILAUDID) injection  1 mg Intravenous Once  . insulin aspart  0-15 Units Subcutaneous TID WC  . insulin aspart  0-5 Units Subcutaneous QHS  . insulin aspart  3 Units Subcutaneous TID WC  . insulin glargine  30 Units Subcutaneous Daily  . lisinopril  10 mg Oral Daily  . loratadine  10 mg Oral Daily  . [COMPLETED] ondansetron (ZOFRAN) IV  4 mg Intravenous Once  . [COMPLETED] ondansetron (ZOFRAN) IV  4 mg Intravenous Once  . [COMPLETED] potassium chloride  40 mEq Oral Once  . simvastatin  10 mg Oral QHS  . [COMPLETED] sodium chloride  1,000 mL Intravenous Once  . [DISCONTINUED] metFORMIN  1,000 mg Oral BID  . [DISCONTINUED] metFORMIN  1,000 mg Oral BID WC   Assessment: 52yo female admitted with pyelonephritis which has symptomatically improved.  Pt is obese with normal renal fxn and normal WBC.  Tm 102.5.  Estimated Creatinine Clearance: 101.3 ml/min (by C-G formula based on Cr of 0.63).   Drug allergies noted.  Goal of Therapy:  Eradicate infection.  Plan:  Aztreonam 1gm IV q8hrs Monitor labs, renal fxn, and cultures per protocol  Duration of therapy per MD  Valrie Hart A 11/30/2012,10:07 AM
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
## 2012-11-30 NOTE — Care Management Note (Unsigned)
    Page 1 of 1   11/30/2012     2:11:22 PM   CARE MANAGEMENT NOTE 11/30/2012  Patient:  Madeline Simpson, Madeline Simpson   Account Number:  [id number]  Date Initiated:  11/30/2012  Documentation initiated by:  Sharrie Rothman  Subjective/Objective Assessment:   Pt admitted from home with pyelonephritis. Pt lives with family and will return home at discharge. Pt is independent.     Action/Plan:   CM spoke with pt about self pay status. Pt stated that she does have insurance but they did not ask for her card. Pt stated that she would get [place] a copy prior to discharge.   Anticipated DC Date:  12/03/2012   Anticipated DC Plan:  HOME/SELF CARE      DC Planning Services  CM consult      Choice offered to / List presented to:             Status of service:  In process, will continue to follow Medicare Important Message given?   (If response is "NO", the following Medicare IM given date fields will be blank) Date Medicare IM given:   Date Additional Medicare IM given:    Discharge Disposition:    Per UR Regulation:    If discussed at Long Length of Stay Meetings, dates discussed:    Comments:  11/30/12 1410 Arlyss Queen, RN BSN CM

## 2012-11-30 NOTE — Progress Notes (Signed)
Inpatient Diabetes Program Recommendations  AACE/ADA: New Consensus Statement on Inpatient Glycemic Control (2013)  Target Ranges:  Prepandial:   less than 140 mg/dL      Peak postprandial:   less than 180 mg/dL (1-2 hours)      Critically ill patients:  140 - 180 mg/dL   Results for ELHAM, FINI (MRN [id number]) as of 11/30/2012 07:28  Ref. Range 11/30/2012 01:34  Glucose-Capillary Latest Range: 70-99 mg/dL 725 (H)  Results for DAWANDA, MAPEL (MRN [id number]) as of 11/30/2012 07:28  Ref. Range 11/29/2012 21:43 11/30/2012 05:47  Glucose Latest Range: 70-99 mg/dL 425 (H) 956 (H)    Inpatient Diabetes Program Recommendations Insulin - Basal: Please consider ordering basal insulin.  Recommend starting with Lantus 30 untis daily and adjust accordingly.  Note: Patient has a history of diabetes and takes Lantus 50 units daily, NPH 18 units BID, Novolog 4 units TID, and Metformin 1000 mg BID at home for diabetes management.  Currently, patient is ordered to receive Novolog 0-15 AC, Novolog 0-5 HS, and Metformin 1000 mg BID for inpatient glycemic control.  Fasting blood glucose this morning 249 mg/dl.  Please consider starting basal insulin to improve glycemic control.  Recommend starting with Lantus 30 units daily and adjust accordingly.  Will continue to follow.  Thanks, Orlando Penner, RN, BSN, CCRN Diabetes Coordinator Inpatient Diabetes Program [phone]
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
## 2012-11-30 NOTE — ED Notes (Signed)
Cipro stopped with 42 ml remaining in the bag to infuse.  Stopped due to patient complaining of itching above the IV site and red streaks at same area.  Not other itching noted.  Normal Saline infusing now.  RN informed in handoff report

## 2012-11-30 NOTE — Progress Notes (Signed)
Nutrition Brief Note  Patient identified on the Malnutrition Screening Tool (MST) Report  Body mass index is 33.14 kg/(m^2). Patient meets criteria for Obesity Class I based on current BMI.   Current diet order is CHO Modified, patient is consuming approximately 75-100% of meals at this time. Labs and medications reviewed.   No nutrition interventions warranted at this time. If nutrition issues arise, please consult RD.   Royann Shivers MS,RD,LDN,CSG Office: [phone] Pager: [phone]

## 2012-12-01 LAB — URINE CULTURE: Colony Count: >=100000

## 2012-12-01 LAB — GLUCOSE, CAPILLARY: Glucose-Capillary: 259 mg/dL — ABNORMAL HIGH (ref 70–99)

## 2012-12-01 LAB — COMPREHENSIVE METABOLIC PANEL
AST: 39 U/L — ABNORMAL HIGH (ref 0–37)
Alkaline Phosphatase: 86 U/L (ref 39–117)
BUN: 7 mg/dL (ref 6–23)
CO2: 25 mEq/L (ref 19–32)
Chloride: 102 mEq/L (ref 96–112)
Creatinine, Ser: 0.62 mg/dL (ref 0.50–1.10)
GFR calc non Af Amer: >90 mL/min (ref >90)
Total Bilirubin: 0.4 mg/dL (ref 0.3–1.2)

## 2012-12-01 LAB — GC/CHLAMYDIA PROBE AMP
CT Probe RNA: NEGATIVE
GC Probe RNA: NEGATIVE

## 2012-12-01 MED ORDER — SULFAMETHOXAZOLE-TMP DS 800-160 MG PO TABS
1.0000 | ORAL_TABLET | Freq: Two times a day (BID) | ORAL | Status: DC
  Administered 2012-12-01 – 2012-12-02 (×2): 1 via ORAL
  Filled 2012-12-01 (×2): qty 1

## 2012-12-01 NOTE — Progress Notes (Signed)
[facility] PROGRESS NOTE  Madeline Simpson ZOX:[id number] DOB: 04-Aug-1961 DOA: 11/29/2012 PCP: Default, Provider, MD Deboraha Sprang at [place]  Assessment/Plan: 1. Acute left-sided pyelonephritis: Much improved, afebrile. Leukocytosis resolved. Culture results unrevealing, multiple morphotypes present. Given clinical improvement we will change to Bactrim oral and monitor. Treatment options limited by allergies. Anaphylaxis with penicillin. Rash with Cipro. 2. Elevated transaminases: Stable. Alkaline phosphatase and total bilirubin normal. Likely secondary to fatty liver as seen on CT.  3. Diabetes mellitus: Fair control. Resume metformin on discharge. 4. Hypertension: Stable. 5. Obesity 6. Small umbilical hernia without complication: Followup as an outpatient.  Code Status:  full code  Family Communication: none present  Disposition Plan: home when improved, likely 4/20   Brendia Sacks, MD  [facility]  Pager [phone] If 7PM-7AM, please contact night-coverage at [email or web address], password [facility] 12/01/2012, 2:13 PM  LOS: 2 days   Brief narrative: 52 year old woman presented with nausea, vomiting, hematuria, the abdominal pain, left-sided flank pain. Urinalysis suggested infection CT scan suggested multifocal left-sided pyelonephritis.  Consultants:    Procedures:    Antibiotics:  Azactam 4/18 >> 4/19  Bactrim 4/19 >>  HPI/Subjective: Continues to feel better. No nausea or vomiting. Tolerating diet. Less abdominal pain.  Objective: Filed Vitals:   12/01/12 0412 12/01/12 0500 12/01/12 0956 12/01/12 1341  BP:  166/108 157/85 162/97  Pulse:  85 85 84  Temp:  98.3 F (36.8 C) 98 F (36.7 C) 98 F (36.7 C)  TempSrc:  Oral Oral Oral  Resp:  16 20 20   Height:      Weight: 97.2 kg (214 lb 4.6 oz)     SpO2:  96% 96% 97%    Intake/Output Summary (Last 24 hours) at 12/01/12 1413 Last data filed at 12/01/12 0845  Gross per 24 hour  Intake    480 ml  Output      0  ml  Net    480 ml   Filed Weights   11/29/12 2000 11/30/12 0122 12/01/12 0412  Weight: 93.441 kg (206 lb) 96 kg (211 lb 10.3 oz) 97.2 kg (214 lb 4.6 oz)    Exam:  General:  Appears calm and comfortable.  Cardiovascular: RRR, no m/r/g. No LE edema. Respiratory: CTA bilaterally, no w/r/r. Normal respiratory effort. Abdomen: soft, ntnd. No left flank pain. Psychiatric: grossly normal mood and affect, speech fluent and appropriate  Data Reviewed: Basic Metabolic Panel:  Recent Labs Lab 11/29/12 2143 11/30/12 0547 12/01/12 0625  NA 133* 137 138  K 3.3* 3.9 4.8  CL 94* 100 102  CO2 26 26 25   GLUCOSE 227* 249* 244*  BUN 12 8 7   CREATININE 0.83 0.63 0.62  CALCIUM 9.5 8.5 9.5   Liver Function Tests:  Recent Labs Lab 11/29/12 2143 11/30/12 0547 12/01/12 0625  AST 15 146* 39*  ALT 17 67* 55*  ALKPHOS 57 86 86  BILITOT 1.0 0.9 0.4  PROT 7.7 6.7 6.9  ALBUMIN 3.5 3.1* 3.0*    Recent Labs Lab 11/29/12 2143  LIPASE 32   CBC:  Recent Labs Lab 11/29/12 2143 11/30/12 0547  WBC 11.5* 8.0  NEUTROABS 7.6  --   HGB 12.2 11.8*  HCT 34.4* 34.2*  MCV 86.0 86.4  PLT 303 297   CBG:  Recent Labs Lab 11/30/12 1140 11/30/12 1623 11/30/12 2036 12/01/12 0737 12/01/12 1139  GLUCAP 286* 222* 279* 219* 277*    Recent Results (from the past 240 hour(s))  URINE CULTURE     Status: None   Collection  Time    11/29/12  9:32 PM      Result Value Range Status   Specimen Description URINE, CLEAN CATCH   Final   Special Requests NONE   Final   Culture  Setup Time 11/29/2012 22:00   Final   Colony Count >=100,000 COLONIES/ML   Final   Culture     Final   Value: Multiple bacterial morphotypes present, none predominant. Suggest appropriate recollection if clinically indicated.   Report Status 12/01/2012 FINAL   Final  GC/CHLAMYDIA PROBE AMP     Status: None   Collection Time    11/29/12 11:47 PM      Result Value Range Status   CT Probe RNA NEGATIVE  NEGATIVE Final   GC  Probe RNA NEGATIVE  NEGATIVE Final   Comment: (NOTE)                                                                                              **Normal Reference Range: Negative**          Assay performed using the Gen-Probe APTIMA COMBO2 (R) Assay.     Acceptable specimen types for this assay include APTIMA Swabs (Unisex,     endocervical, urethral, or vaginal), first void urine, and ThinPrep     liquid based cytology samples.  WET PREP, GENITAL     Status: Abnormal   Collection Time    11/29/12 11:47 PM      Result Value Range Status   Yeast Wet Prep HPF POC NONE SEEN  NONE SEEN Final   Trich, Wet Prep NONE SEEN  NONE SEEN Final   Clue Cells Wet Prep HPF POC NONE SEEN  NONE SEEN Final   WBC, Wet Prep HPF POC FEW (*) NONE SEEN Final     Studies: Ct Abdomen Pelvis W Contrast  11/29/2012  *RADIOLOGY REPORT*  Clinical Data: Bilateral lower quadrant abdominal pain, radiating into the left flank.  Fever.  CT ABDOMEN AND PELVIS WITH CONTRAST  Technique:  Multidetector CT imaging of the abdomen and pelvis was performed following the standard protocol during bolus administration of intravenous contrast.  Contrast: 100 mL of Omnipaque 300 IV contrast  Comparison: Lumbar spine radiographs performed 09/26/2007  Findings: Mild bibasilar atelectasis is noted.  There is mild diffuse fatty infiltration within the liver, with mild peripheral sparing.  The liver and spleen are otherwise unremarkable in appearance.  The patient is status post cholecystectomy, with clips noted along the gallbladder fossa.  The pancreas and adrenal glands are unremarkable in appearance.  Several vague areas of decreased enhancement are seen within the left kidney, compatible with multiple focal areas of pyelonephritis.  Minimal associated soft tissue stranding is seen. The right kidney is unremarkable in appearance.  There is no evidence of hydronephrosis.  No renal or ureteral stones are seen.  No free fluid is identified.  The  small bowel is unremarkable in appearance.  The stomach is within normal limits.  No acute vascular abnormalities are seen.  Minimal calcification is noted along the bilateral internal iliac arteries.  A small wide-based umbilical hernia is noted, containing only fat.  The appendix is normal in caliber, without evidence for appendicitis.  Mild scattered diverticulosis is noted along the descending colon, without evidence of diverticulitis.  The colon is otherwise unremarkable in appearance.  The bladder is mildly distended and grossly unremarkable.  The patient is status post hysterectomy.  No suspicious adnexal masses are seen.  No inguinal lymphadenopathy is seen.  No acute osseous abnormalities are identified.  IMPRESSION:  1.  Several vague areas of decreased enhancement within the left kidney, compatible with multiple focal areas of pyelonephritis. Minimal associated soft tissue inflammation seen.  No evidence of hydronephrosis; no renal or ureteral stones seen. 2.  Small wide-based umbilical hernia noted, containing only fat. 3.  Mild scattered diverticulosis along the descending colon, without evidence of diverticulitis. 4.  Mild bibasilar atelectasis noted. 5.  Mild diffuse fatty infiltration within the liver.   Original Report Authenticated By: Tonia Ghent, M.D.     Scheduled Meds: . aspirin EC  81 mg Oral Daily  . aztreonam  1 g Intravenous Q8H  . enoxaparin (LOVENOX) injection  40 mg Subcutaneous Q24H  . famotidine  20 mg Oral BID  . feeding supplement  237 mL Oral BID  . insulin aspart  0-15 Units Subcutaneous TID WC  . insulin aspart  0-5 Units Subcutaneous QHS  . insulin aspart  3 Units Subcutaneous TID WC  . insulin glargine  30 Units Subcutaneous Daily  . lisinopril  10 mg Oral Daily  . loratadine  10 mg Oral Daily  . simvastatin  10 mg Oral QHS   Continuous Infusions: . 0.9 % NaCl with KCl 20 mEq / L 100 mL/hr at 11/30/12 1500    Principal Problem:   Acute  pyelonephritis Active Problems:   HTN (hypertension)   DM (diabetes mellitus), type 2   Hyperlipidemia   Obesity, unspecified     Brendia Sacks, MD  [facility] Pager [phone] If 7PM-7AM, please contact night-coverage at [email or web address], password [facility] 12/01/2012, 2:13 PM  LOS: 2 days   Time spent: 15 minutes

## 2012-12-02 MED ORDER — SULFAMETHOXAZOLE-TMP DS 800-160 MG PO TABS: 1.0000 | ORAL_TABLET | Freq: Two times a day (BID) | ORAL | Status: DC

## 2012-12-02 NOTE — Progress Notes (Signed)
Pt. D/c instructions provided, IV removed, medications reviewed, and prescriptions given.

## 2012-12-02 NOTE — Discharge Summary (Signed)
Physician Discharge Summary  Madeline Simpson ZOX:[id number] DOB: [birth year] DOA: 11/29/2012  PCP: Darrow Bussing MD Deboraha Sprang at [place]  Admit date: 11/29/2012 Discharge date: 12/02/2012  Recommendations for Outpatient Follow-up:  1. Followup resolution of acute pyelonephritis 2. Followup repeat urine culture 3. Followup umbilical hernia without complication as clinically indicated   Follow-up Information   Follow up with Darrow Bussing, MD In 1 week.   Contact information:   [address] [place] [address] [phone]      Discharge Diagnoses:  1. Acute left-sided pyelonephritis 2. Diabetes mellitus uncontrolled by hemoglobin A1c 3. Hypertension  Discharge Condition: Improved Disposition: Home  Diet recommendation: Heart healthy, diabetic  Filed Weights   11/30/12 0122 12/01/12 0412 12/02/12 0500  Weight: 96 kg (211 lb 10.3 oz) 97.2 kg (214 lb 4.6 oz) 98 kg (216 lb 0.8 oz)    History of present illness:  52 year old woman presented with nausea, vomiting, hematuria, the abdominal pain, left-sided flank pain. Urinalysis suggested infection CT scan suggested multifocal left-sided pyelonephritis.  Hospital Course:  Ms. Kil was admitted for further treatment of acute pyelonephritis. Because of history of anaphylaxis to penicillin she was initially treated with ciprofloxacin. However she developed a rash upper arm shortly after infusion began. She was subsequently placed on Azactam with rapid improvement. Urine culture unfortunately revealed multiple bacterial morphotypes with none predominant. Repeat urine culture is pending but does not appear that there will be any clinical data to guide further empiric therapy. Allergies limit therapeutic options. Plan as below.  1. Acute left-sided pyelonephritis: Continues to improve, remains afebrile. Leukocytosis resolved. Culture results unrevealing, multiple morphotypes present. Repeat culture pending. Review of previous  cultures from 2012 2013 revealed no growth. Treatment options limited by allergies. Anaphylaxis with penicillin. Rash with Cipro. Received 36 hours of aztreonam with rapid improvement. Placed on empiric Bactrim. Followup repeat culture. 2. Elevated transaminases: Stable. Alkaline phosphatase and total bilirubin normal. Likely secondary to fatty liver as seen on CT.   3. Diabetes mellitus uncontrolled by hemoglobin A1c 9.2: Fair control. Continue long-acting insulin. Patient confirms that she is on both Lantus and Novolin. Defer to her primary care provider. Resume metformin on discharge. 4. Hypertension: Fair control. 5. Obesity 6. Small umbilical hernia without complication: Followup as an outpatient.  Consultants:    Procedures:    Antibiotics:  Azactam 4/18 >> 4/19  Bactrim 4/19 >> 4/27  Discharge Instructions  Discharge Orders   Future Orders Complete By Expires     Activity as tolerated - No restrictions  As directed     Diet - low sodium heart healthy  As directed     Diet Carb Modified  As directed     Discharge instructions  As directed     Comments:      You were diagnosed with a kidney infection. Be sure to complete antibiotics (Bactrim) as directed. Resume your home insulin regimen as directed without change. Call your physician or seek immediate medical attention for fever, increased pain, vomiting or worsening of your condition.        Medication List    STOP taking these medications       insulin aspart 100 UNIT/ML injection  Commonly known as:  novoLOG      TAKE these medications       aspirin 81 MG EC tablet  Take 1 tablet (81 mg total) by mouth daily.     hydrochlorothiazide 12.5 MG capsule  Commonly known as:  MICROZIDE  Take 1 capsule (12.5 mg total)  by mouth daily.     insulin glargine 100 UNIT/ML injection  Commonly known as:  LANTUS  Inject 50 Units into the skin daily.     insulin NPH 100 UNIT/ML injection  Commonly known as:  HUMULIN  N,NOVOLIN N  Inject 18 Units into the skin 2 (two) times daily before a meal.     lisinopril 10 MG tablet  Commonly known as:  PRINIVIL,ZESTRIL  Take 1 tablet (10 mg total) by mouth daily.     metFORMIN 1000 MG tablet  Commonly known as:  GLUCOPHAGE  Take 1,000 mg by mouth 2 (two) times daily.     multivitamin with minerals Tabs  Take 1 tablet by mouth daily.     naphazoline-pheniramine 0.025-0.3 % ophthalmic solution  Commonly known as:  NAPHCON-A  Place 1 drop into both eyes 4 (four) times daily as needed. For red eyes     simvastatin 10 MG tablet  Commonly known as:  ZOCOR  Take 1 tablet (10 mg total) by mouth at bedtime.     sulfamethoxazole-trimethoprim 800-160 MG per tablet  Commonly known as:  BACTRIM DS  Take 1 tablet by mouth every 12 (twelve) hours.         The results of significant diagnostics from this hospitalization (including imaging, microbiology, ancillary and laboratory) are listed below for reference.    Significant Diagnostic Studies: Ct Abdomen Pelvis W Contrast  11/29/2012  *RADIOLOGY REPORT*  Clinical Data: Bilateral lower quadrant abdominal pain, radiating into the left flank.  Fever.  CT ABDOMEN AND PELVIS WITH CONTRAST  Technique:  Multidetector CT imaging of the abdomen and pelvis was performed following the standard protocol during bolus administration of intravenous contrast.  Contrast: 100 mL of Omnipaque 300 IV contrast  Comparison: Lumbar spine radiographs performed 09/26/2007  Findings: Mild bibasilar atelectasis is noted.  There is mild diffuse fatty infiltration within the liver, with mild peripheral sparing.  The liver and spleen are otherwise unremarkable in appearance.  The patient is status post cholecystectomy, with clips noted along the gallbladder fossa.  The pancreas and adrenal glands are unremarkable in appearance.  Several vague areas of decreased enhancement are seen within the left kidney, compatible with multiple focal areas of  pyelonephritis.  Minimal associated soft tissue stranding is seen. The right kidney is unremarkable in appearance.  There is no evidence of hydronephrosis.  No renal or ureteral stones are seen.  No free fluid is identified.  The small bowel is unremarkable in appearance.  The stomach is within normal limits.  No acute vascular abnormalities are seen.  Minimal calcification is noted along the bilateral internal iliac arteries.  A small wide-based umbilical hernia is noted, containing only fat.  The appendix is normal in caliber, without evidence for appendicitis.  Mild scattered diverticulosis is noted along the descending colon, without evidence of diverticulitis.  The colon is otherwise unremarkable in appearance.  The bladder is mildly distended and grossly unremarkable.  The patient is status post hysterectomy.  No suspicious adnexal masses are seen.  No inguinal lymphadenopathy is seen.  No acute osseous abnormalities are identified.  IMPRESSION:  1.  Several vague areas of decreased enhancement within the left kidney, compatible with multiple focal areas of pyelonephritis. Minimal associated soft tissue inflammation seen.  No evidence of hydronephrosis; no renal or ureteral stones seen. 2.  Small wide-based umbilical hernia noted, containing only fat. 3.  Mild scattered diverticulosis along the descending colon, without evidence of diverticulitis. 4.  Mild bibasilar atelectasis noted.  5.  Mild diffuse fatty infiltration within the liver.   Original Report Authenticated By: Tonia Ghent, M.D.     Microbiology: Recent Results (from the past 240 hour(s))  URINE CULTURE     Status: None   Collection Time    11/29/12  9:32 PM      Result Value Range Status   Specimen Description URINE, CLEAN CATCH   Final   Special Requests NONE   Final   Culture  Setup Time 11/29/2012 22:00   Final   Colony Count >=100,000 COLONIES/ML   Final   Culture     Final   Value: Multiple bacterial morphotypes present, none  predominant. Suggest appropriate recollection if clinically indicated.   Report Status 12/01/2012 FINAL   Final  GC/CHLAMYDIA PROBE AMP     Status: None   Collection Time    11/29/12 11:47 PM      Result Value Range Status   CT Probe RNA NEGATIVE  NEGATIVE Final   GC Probe RNA NEGATIVE  NEGATIVE Final   Comment: (NOTE)                                                                                              **Normal Reference Range: Negative**          Assay performed using the Gen-Probe APTIMA COMBO2 (R) Assay.     Acceptable specimen types for this assay include APTIMA Swabs (Unisex,     endocervical, urethral, or vaginal), first void urine, and ThinPrep     liquid based cytology samples.  WET PREP, GENITAL     Status: Abnormal   Collection Time    11/29/12 11:47 PM      Result Value Range Status   Yeast Wet Prep HPF POC NONE SEEN  NONE SEEN Final   Trich, Wet Prep NONE SEEN  NONE SEEN Final   Clue Cells Wet Prep HPF POC NONE SEEN  NONE SEEN Final   WBC, Wet Prep HPF POC FEW (*) NONE SEEN Final     Labs: Basic Metabolic Panel:  Recent Labs Lab 11/29/12 2143 11/30/12 0547 12/01/12 0625  NA 133* 137 138  K 3.3* 3.9 4.8  CL 94* 100 102  CO2 26 26 25   GLUCOSE 227* 249* 244*  BUN 12 8 7   CREATININE 0.83 0.63 0.62  CALCIUM 9.5 8.5 9.5   Liver Function Tests:  Recent Labs Lab 11/29/12 2143 11/30/12 0547 12/01/12 0625  AST 15 146* 39*  ALT 17 67* 55*  ALKPHOS 57 86 86  BILITOT 1.0 0.9 0.4  PROT 7.7 6.7 6.9  ALBUMIN 3.5 3.1* 3.0*    Recent Labs Lab 11/29/12 2143  LIPASE 32   CBC:  Recent Labs Lab 11/29/12 2143 11/30/12 0547  WBC 11.5* 8.0  NEUTROABS 7.6  --   HGB 12.2 11.8*  HCT 34.4* 34.2*  MCV 86.0 86.4  PLT 303 297   CBG:  Recent Labs Lab 11/30/12 2036 12/01/12 0737 12/01/12 1139 12/01/12 1630 12/01/12 2050  GLUCAP 279* 219* 277* 212* 259*    Principal Problem:   Acute pyelonephritis Active Problems:   HTN (hypertension)  DM  (diabetes mellitus), type 2   Hyperlipidemia   Obesity, unspecified   Time coordinating discharge: 25 minutes  Signed:  Brendia Sacks, MD [facility] 12/02/2012, 10:24 AM

## 2012-12-02 NOTE — Progress Notes (Signed)
[facility] PROGRESS NOTE  Madeline Simpson QMV:[id number] DOB: Oct 28, 1960 DOA: 11/29/2012 PCP: Darrow Bussing MD Deboraha Sprang at [place]  Assessment/Plan: 1. Acute left-sided pyelonephritis: Continues to improve, remains afebrile. Leukocytosis resolved. Culture results unrevealing, multiple morphotypes present. Repeat culture pending. Review of previous cultures from 2012 2013 revealed no growth. Treatment options limited by allergies. Anaphylaxis with penicillin. Rash with Cipro. Received 36 hours of aztreonam with rapid improvement. Placed on empiric Bactrim. Followup repeat culture. 2. Elevated transaminases: Stable. Alkaline phosphatase and total bilirubin normal. Likely secondary to fatty liver as seen on CT.  3. Diabetes mellitus uncontrolled by hemoglobin A1c 9.2: Fair control. Continue long-acting insulin. Resume metformin on discharge. 4. Hypertension: Fair control. 5. Obesity 6. Small umbilical hernia without complication: Followup as an outpatient.  Code Status:  full code  Family Communication: none present  Disposition Plan: Home today.  Brendia Sacks, MD  [facility]  Pager [phone] If 7PM-7AM, please contact night-coverage at [email or web address], password [facility] 12/02/2012, 9:43 AM  LOS: 3 days   Brief narrative: 52 year old woman presented with nausea, vomiting, hematuria, the abdominal pain, left-sided flank pain. Urinalysis suggested infection CT scan suggested multifocal left-sided pyelonephritis.  Consultants:    Procedures:    Antibiotics:  Azactam 4/18 >> 4/19  Bactrim 4/19 >> 4/27  HPI/Subjective: Afebrile, hypertensive. Feels much better. Minimal nausea, no vomiting. Minimal left flank pain. Eating well. Wants to go home.  Objective: Filed Vitals:   12/01/12 1341 12/01/12 2100 12/02/12 0158 12/02/12 0500  BP: 162/97 152/84 146/83 166/101  Pulse: 84 83 80 85  Temp: 98 F (36.7 C) 97.5 F (36.4 C) 98.6 F (37 C) 97.8 F (36.6 C)  TempSrc:  Oral Oral Oral Oral  Resp: 20 20 16 14   Height:      Weight:    98 kg (216 lb 0.8 oz)  SpO2: 97% 96% 97% 95%    Intake/Output Summary (Last 24 hours) at 12/02/12 0943 Last data filed at 12/01/12 1800  Gross per 24 hour  Intake    600 ml  Output      0 ml  Net    600 ml   Filed Weights   11/30/12 0122 12/01/12 0412 12/02/12 0500  Weight: 96 kg (211 lb 10.3 oz) 97.2 kg (214 lb 4.6 oz) 98 kg (216 lb 0.8 oz)    Exam:  General:  Appears calm and comfortable.  Cardiovascular: RRR, no m/r/g.  Respiratory: CTA bilaterally, no w/r/r. Normal respiratory effort. Abdomen: soft, ntnd. No left flank pain. Psychiatric: grossly normal mood and affect, speech fluent and appropriate  Data Reviewed: Basic Metabolic Panel:  Recent Labs Lab 11/29/12 2143 11/30/12 0547 12/01/12 0625  NA 133* 137 138  K 3.3* 3.9 4.8  CL 94* 100 102  CO2 26 26 25   GLUCOSE 227* 249* 244*  BUN 12 8 7   CREATININE 0.83 0.63 0.62  CALCIUM 9.5 8.5 9.5   Liver Function Tests:  Recent Labs Lab 11/29/12 2143 11/30/12 0547 12/01/12 0625  AST 15 146* 39*  ALT 17 67* 55*  ALKPHOS 57 86 86  BILITOT 1.0 0.9 0.4  PROT 7.7 6.7 6.9  ALBUMIN 3.5 3.1* 3.0*    Recent Labs Lab 11/29/12 2143  LIPASE 32   CBC:  Recent Labs Lab 11/29/12 2143 11/30/12 0547  WBC 11.5* 8.0  NEUTROABS 7.6  --   HGB 12.2 11.8*  HCT 34.4* 34.2*  MCV 86.0 86.4  PLT 303 297   CBG:  Recent Labs Lab 11/30/12 2036 12/01/12 0737 12/01/12 1139  12/01/12 1630 12/01/12 2050  GLUCAP 279* 219* 277* 212* 259*    Recent Results (from the past 240 hour(s))  URINE CULTURE     Status: None   Collection Time    11/29/12  9:32 PM      Result Value Range Status   Specimen Description URINE, CLEAN CATCH   Final   Special Requests NONE   Final   Culture  Setup Time 11/29/2012 22:00   Final   Colony Count >=100,000 COLONIES/ML   Final   Culture     Final   Value: Multiple bacterial morphotypes present, none predominant. Suggest  appropriate recollection if clinically indicated.   Report Status 12/01/2012 FINAL   Final  GC/CHLAMYDIA PROBE AMP     Status: None   Collection Time    11/29/12 11:47 PM      Result Value Range Status   CT Probe RNA NEGATIVE  NEGATIVE Final   GC Probe RNA NEGATIVE  NEGATIVE Final   Comment: (NOTE)                                                                                              **Normal Reference Range: Negative**          Assay performed using the Gen-Probe APTIMA COMBO2 (R) Assay.     Acceptable specimen types for this assay include APTIMA Swabs (Unisex,     endocervical, urethral, or vaginal), first void urine, and ThinPrep     liquid based cytology samples.  WET PREP, GENITAL     Status: Abnormal   Collection Time    11/29/12 11:47 PM      Result Value Range Status   Yeast Wet Prep HPF POC NONE SEEN  NONE SEEN Final   Trich, Wet Prep NONE SEEN  NONE SEEN Final   Clue Cells Wet Prep HPF POC NONE SEEN  NONE SEEN Final   WBC, Wet Prep HPF POC FEW (*) NONE SEEN Final     Studies: No results found.  Scheduled Meds: . aspirin EC  81 mg Oral Daily  . enoxaparin (LOVENOX) injection  40 mg Subcutaneous Q24H  . famotidine  20 mg Oral BID  . feeding supplement  237 mL Oral BID  . insulin aspart  0-15 Units Subcutaneous TID WC  . insulin aspart  0-5 Units Subcutaneous QHS  . insulin aspart  3 Units Subcutaneous TID WC  . insulin glargine  30 Units Subcutaneous Daily  . lisinopril  10 mg Oral Daily  . loratadine  10 mg Oral Daily  . simvastatin  10 mg Oral QHS  . sulfamethoxazole-trimethoprim  1 tablet Oral Q12H   Continuous Infusions:    Principal Problem:   Acute pyelonephritis Active Problems:   HTN (hypertension)   DM (diabetes mellitus), type 2   Hyperlipidemia   Obesity, unspecified     Brendia Sacks, MD  [facility] Pager [phone] If 7PM-7AM, please contact night-coverage at [email or web address], password [facility] 12/02/2012, 9:43 AM  LOS: 3 days

## 2012-12-03 LAB — GLUCOSE, CAPILLARY

## 2012-12-04 LAB — URINE CULTURE: Colony Count: NO GROWTH

## 2012-12-18 ENCOUNTER — Emergency Department (HOSPITAL_COMMUNITY)
Admission: EM | Admit: 2012-12-18 | Discharge: 2012-12-18 | Disposition: A | Discharge status: Home / Self Care | Payer: BC Managed Care – PPO | Attending: Emergency Medicine | Admitting: Emergency Medicine

## 2012-12-18 ENCOUNTER — Emergency Department (HOSPITAL_COMMUNITY): Payer: BC Managed Care – PPO

## 2012-12-18 ENCOUNTER — Encounter (HOSPITAL_COMMUNITY): Payer: Self-pay | Admitting: *Deleted

## 2012-12-18 DIAGNOSIS — Z794 Long term (current) use of insulin: Secondary | ICD-10-CM | POA: Insufficient documentation

## 2012-12-18 DIAGNOSIS — Y9389 Activity, other specified: Secondary | ICD-10-CM | POA: Insufficient documentation

## 2012-12-18 DIAGNOSIS — E119 Type 2 diabetes mellitus without complications: Secondary | ICD-10-CM | POA: Insufficient documentation

## 2012-12-18 DIAGNOSIS — Z87891 Personal history of nicotine dependence: Secondary | ICD-10-CM | POA: Insufficient documentation

## 2012-12-18 DIAGNOSIS — Z79899 Other long term (current) drug therapy: Secondary | ICD-10-CM | POA: Insufficient documentation

## 2012-12-18 DIAGNOSIS — Y929 Unspecified place or not applicable: Secondary | ICD-10-CM | POA: Insufficient documentation

## 2012-12-18 DIAGNOSIS — Z7982 Long term (current) use of aspirin: Secondary | ICD-10-CM | POA: Insufficient documentation

## 2012-12-18 DIAGNOSIS — X500XXA Overexertion from strenuous movement or load, initial encounter: Secondary | ICD-10-CM | POA: Insufficient documentation

## 2012-12-18 DIAGNOSIS — E785 Hyperlipidemia, unspecified: Secondary | ICD-10-CM | POA: Insufficient documentation

## 2012-12-18 DIAGNOSIS — S93601A Unspecified sprain of right foot, initial encounter: Secondary | ICD-10-CM

## 2012-12-18 DIAGNOSIS — I10 Essential (primary) hypertension: Secondary | ICD-10-CM | POA: Insufficient documentation

## 2012-12-18 DIAGNOSIS — S93609A Unspecified sprain of unspecified foot, initial encounter: Secondary | ICD-10-CM | POA: Insufficient documentation

## 2012-12-18 MED ORDER — TRAMADOL HCL 50 MG PO TABS: 50.0000 mg | ORAL_TABLET | Freq: Four times a day (QID) | ORAL | Status: DC | PRN

## 2012-12-18 NOTE — ED Provider Notes (Signed)
History     CSN: [id number]  Arrival date & time 12/18/12  1317   First MD Initiated Contact with Patient 12/18/12 1332      Chief Complaint  Patient presents with  . Foot Injury    (Consider location/radiation/quality/duration/timing/severity/associated sxs/prior treatment) [name] presenting with foot injury. The history is provided by the patient.  Foot Injury Associated symptoms: no fever   pt indicates 2 days ago twisted right foot while walking in high heels. C/o constant, dull, moderate pain to right mid to distal foot since injury. Worse w palpation and walking. No ankle or knee pain. Skin intact. Denies other pain or injury.     Past Medical History  Diagnosis Date  . Diabetes mellitus   . Hypertension   . Hyperlipidemia   . H/O tobacco use, presenting hazards to health   . Complication of anesthesia     "came out fighting, put back under aneth"    Past Surgical History  Procedure Laterality Date  . Cholecystectomy    . Abdominal hysterectomy      partial  . Knee surgery      right  . Hand surgery      Family History  Problem Relation Age of Onset  . Cerebral aneurysm Mother 78  . Hypertension Mother     History  Substance Use Topics  . Smoking status: Former Smoker -- 1.00 packs/day for 25 years    Types: Cigarettes    Quit date: 04/09/2011  . Smokeless tobacco: Never Used  . Alcohol Use: No    OB History   Grav Para Term Preterm Abortions TAB SAB Ect Mult Living                  Review of Systems  Constitutional: Negative for fever and chills.  Skin: Negative for wound.  Neurological: Negative for weakness and numbness.    Allergies  Penicillins; Apple; Codeine; Naproxen; Shellfish allergy; and Ciprofloxacin  Home Medications   Current Outpatient Rx  Name  Route  Sig  Dispense  Refill  . aspirin EC 81 MG EC tablet   Oral   Take 1 tablet (81 mg total) by mouth daily.         . hydrochlorothiazide  (MICROZIDE) 12.5 MG capsule   Oral   Take 1 capsule (12.5 mg total) by mouth daily.   30 capsule   0   . insulin glargine (LANTUS) 100 UNIT/ML injection   Subcutaneous   Inject 50 Units into the skin daily.         . insulin NPH (HUMULIN N,NOVOLIN N) 100 UNIT/ML injection   Subcutaneous   Inject 18 Units into the skin 2 (two) times daily before a meal.   1 vial   0   . lisinopril (PRINIVIL,ZESTRIL) 10 MG tablet   Oral   Take 1 tablet (10 mg total) by mouth daily.   30 tablet   0   . metFORMIN (GLUCOPHAGE) 1000 MG tablet   Oral   Take 1,000 mg by mouth 2 (two) times daily.         . Multiple Vitamin (MULITIVITAMIN WITH MINERALS) TABS   Oral   Take 1 tablet by mouth daily.         . naphazoline-pheniramine (NAPHCON-A) 0.025-0.3 % ophthalmic solution   Both Eyes   Place 1 drop into both eyes 4 (four) times daily as needed. For red eyes         .  simvastatin (ZOCOR) 10 MG tablet   Oral   Take 1 tablet (10 mg total) by mouth at bedtime.   30 tablet   0   . sulfamethoxazole-trimethoprim (BACTRIM DS) 800-160 MG per tablet   Oral   Take 1 tablet by mouth every 12 (twelve) hours.   15 tablet   0     There were no vitals taken for this visit.  Physical Exam  Nursing note and vitals reviewed. Constitutional: She appears well-developed and well-nourished. No distress.  HENT:  Head: Atraumatic.  Eyes: Conjunctivae are normal. No scleral icterus.  Neck: Neck supple. No tracheal deviation present.  Cardiovascular: Normal rate.   Pulmonary/Chest: Effort normal. No respiratory distress.  Abdominal: Normal appearance.  Musculoskeletal:  Tenderness right mid to distal foot dorsally w mild sts. No skin changes, erythema, or increased warmth. Skin is intact, no wounds. No ankle/malleolar tenderness. Ankle stable. Distal pulses palp. Normal cap refill in toes.   Neurological: She is alert.  Skin: Skin is warm and dry. No rash noted.  Psychiatric: She has a normal  mood and affect.    ED Course  Procedures (including critical care time)    Dg Foot Complete Right  12/18/2012  *RADIOLOGY REPORT*  Clinical Data: Recent injury with pain  RIGHT FOOT COMPLETE - 3+ VIEW  Comparison: None.  Findings: No acute fracture or dislocation is noted.  No soft tissue abnormality is seen.  IMPRESSION: No acute abnormality noted.   Original Report Authenticated By: Alcide Clever, M.D.       MDM  Xr.  Reviewed nursing notes and prior charts for additional history.   xrays no fracture.  Crutches.  Icepack.          Suzi Roots, MD 12/18/12 [phone]

## 2012-12-18 NOTE — ED Notes (Signed)
Pt c/o right foot pain, reports rolling right ankle on Sunday while wearing 6 inch heals.

## 2013-04-20 ENCOUNTER — Observation Stay (HOSPITAL_COMMUNITY)
Admission: EM | Admit: 2013-04-20 | Discharge: 2013-04-21 | Disposition: A | Discharge status: Home / Self Care | Payer: BC Managed Care – PPO | Attending: Internal Medicine | Admitting: Internal Medicine

## 2013-04-20 ENCOUNTER — Encounter (HOSPITAL_COMMUNITY): Payer: Self-pay | Admitting: Emergency Medicine

## 2013-04-20 ENCOUNTER — Emergency Department (HOSPITAL_COMMUNITY): Payer: BC Managed Care – PPO

## 2013-04-20 DIAGNOSIS — IMO0001 Reserved for inherently not codable concepts without codable children: Secondary | ICD-10-CM | POA: Insufficient documentation

## 2013-04-20 DIAGNOSIS — R0789 Other chest pain: Principal | ICD-10-CM | POA: Insufficient documentation

## 2013-04-20 DIAGNOSIS — Z794 Long term (current) use of insulin: Secondary | ICD-10-CM | POA: Insufficient documentation

## 2013-04-20 DIAGNOSIS — R739 Hyperglycemia, unspecified: Secondary | ICD-10-CM

## 2013-04-20 DIAGNOSIS — E669 Obesity, unspecified: Secondary | ICD-10-CM

## 2013-04-20 DIAGNOSIS — Z9889 Other specified postprocedural states: Secondary | ICD-10-CM

## 2013-04-20 DIAGNOSIS — R42 Dizziness and giddiness: Secondary | ICD-10-CM

## 2013-04-20 DIAGNOSIS — A5901 Trichomonal vulvovaginitis: Secondary | ICD-10-CM

## 2013-04-20 DIAGNOSIS — E119 Type 2 diabetes mellitus without complications: Secondary | ICD-10-CM

## 2013-04-20 DIAGNOSIS — I10 Essential (primary) hypertension: Secondary | ICD-10-CM

## 2013-04-20 DIAGNOSIS — N10 Acute tubulo-interstitial nephritis: Secondary | ICD-10-CM

## 2013-04-20 DIAGNOSIS — Z87891 Personal history of nicotine dependence: Secondary | ICD-10-CM

## 2013-04-20 DIAGNOSIS — Z90711 Acquired absence of uterus with remaining cervical stump: Secondary | ICD-10-CM

## 2013-04-20 DIAGNOSIS — R079 Chest pain, unspecified: Secondary | ICD-10-CM

## 2013-04-20 DIAGNOSIS — R109 Unspecified abdominal pain: Secondary | ICD-10-CM | POA: Diagnosis present

## 2013-04-20 DIAGNOSIS — D72829 Elevated white blood cell count, unspecified: Secondary | ICD-10-CM

## 2013-04-20 DIAGNOSIS — N390 Urinary tract infection, site not specified: Secondary | ICD-10-CM

## 2013-04-20 DIAGNOSIS — E785 Hyperlipidemia, unspecified: Secondary | ICD-10-CM

## 2013-04-20 DIAGNOSIS — Z9049 Acquired absence of other specified parts of digestive tract: Secondary | ICD-10-CM

## 2013-04-20 LAB — URINE MICROSCOPIC-ADD ON

## 2013-04-20 LAB — CBC WITH DIFFERENTIAL/PLATELET
Basophils Absolute: 0 10*3/uL (ref 0.0–0.1)
Basophils Relative: 0 % (ref 0–1)
Eosinophils Absolute: 0.2 10*3/uL (ref 0.0–0.7)
Eosinophils Relative: 3 % (ref 0–5)
HCT: 40.7 % (ref 36.0–46.0)
MCH: 30.4 pg (ref 26.0–34.0)
MCHC: 35.6 g/dL (ref 30.0–36.0)
Monocytes Absolute: 0.6 10*3/uL (ref 0.1–1.0)
Neutro Abs: 5 10*3/uL (ref 1.7–7.7)
RDW: 12.7 % (ref 11.5–15.5)

## 2013-04-20 LAB — HEPATIC FUNCTION PANEL
Alkaline Phosphatase: 61 U/L (ref 39–117)
Bilirubin, Direct: 0.1 mg/dL (ref 0.0–0.3)
Indirect Bilirubin: 0.6 mg/dL (ref 0.3–0.9)
Total Bilirubin: 0.7 mg/dL (ref 0.3–1.2)

## 2013-04-20 LAB — TROPONIN I: Troponin I: <0.3 ng/mL (ref <0.30)

## 2013-04-20 LAB — PRO B NATRIURETIC PEPTIDE: Pro B Natriuretic peptide (BNP): 8 pg/mL (ref 0–125)

## 2013-04-20 LAB — BASIC METABOLIC PANEL
BUN: 12 mg/dL (ref 6–23)
Calcium: 10 mg/dL (ref 8.4–10.5)
Chloride: 97 mEq/L (ref 96–112)
Creatinine, Ser: 0.63 mg/dL (ref 0.50–1.10)
GFR calc Af Amer: >90 mL/min (ref >90)
GFR calc non Af Amer: >90 mL/min (ref >90)

## 2013-04-20 LAB — URINALYSIS, ROUTINE W REFLEX MICROSCOPIC
Bilirubin Urine: NEGATIVE
Ketones, ur: 15 mg/dL — AB
Leukocytes, UA: NEGATIVE
Nitrite: POSITIVE — AB
Protein, ur: 100 mg/dL — AB

## 2013-04-20 LAB — HEMOGLOBIN A1C: Mean Plasma Glucose: 298 mg/dL — ABNORMAL HIGH (ref <117)

## 2013-04-20 LAB — GLUCOSE, CAPILLARY: Glucose-Capillary: 135 mg/dL — ABNORMAL HIGH (ref 70–99)

## 2013-04-20 LAB — LIPASE, BLOOD: Lipase: 69 U/L — ABNORMAL HIGH (ref 11–59)

## 2013-04-20 MED ORDER — PNEUMOCOCCAL VAC POLYVALENT 25 MCG/0.5ML IJ INJ
0.5000 mL | INJECTION | INTRAMUSCULAR | Status: AC
  Administered 2013-04-21: 0.5 mL via INTRAMUSCULAR
  Filled 2013-04-20 (×2): qty 0.5

## 2013-04-20 MED ORDER — INSULIN ASPART 100 UNIT/ML ~~LOC~~ SOLN
0.0000 [IU] | Freq: Three times a day (TID) | SUBCUTANEOUS | Status: DC
  Administered 2013-04-21 (×2): 8 [IU] via SUBCUTANEOUS

## 2013-04-20 MED ORDER — ACETAMINOPHEN 500 MG PO TABS: 500.0000 mg | ORAL_TABLET | Freq: Four times a day (QID) | ORAL | Status: DC | PRN

## 2013-04-20 MED ORDER — INSULIN ASPART 100 UNIT/ML ~~LOC~~ SOLN
15.0000 [IU] | Freq: Once | SUBCUTANEOUS | Status: AC
  Administered 2013-04-20: 15 [IU] via SUBCUTANEOUS
  Filled 2013-04-20: qty 1

## 2013-04-20 MED ORDER — ONDANSETRON HCL 4 MG/2ML IJ SOLN
4.0000 mg | Freq: Once | INTRAMUSCULAR | Status: AC
  Administered 2013-04-20: 4 mg via INTRAVENOUS
  Filled 2013-04-20: qty 2

## 2013-04-20 MED ORDER — SODIUM CHLORIDE 0.9 % IJ SOLN: 3.0000 mL | Freq: Two times a day (BID) | INTRAMUSCULAR | Status: DC

## 2013-04-20 MED ORDER — DIPHENHYDRAMINE HCL 25 MG PO CAPS
25.0000 mg | ORAL_CAPSULE | Freq: Three times a day (TID) | ORAL | Status: DC | PRN
  Administered 2013-04-20: 21:00:00 25 mg via ORAL
  Filled 2013-04-20: qty 1

## 2013-04-20 MED ORDER — NITROGLYCERIN 0.4 MG SL SUBL: 0.4000 mg | SUBLINGUAL_TABLET | SUBLINGUAL | Status: DC | PRN

## 2013-04-20 MED ORDER — SODIUM CHLORIDE 0.9 % IV SOLN
INTRAVENOUS | Status: DC
  Administered 2013-04-20: 15:00:00 via INTRAVENOUS

## 2013-04-20 MED ORDER — INSULIN NPH (HUMAN) (ISOPHANE) 100 UNIT/ML ~~LOC~~ SUSP
18.0000 [IU] | Freq: Two times a day (BID) | SUBCUTANEOUS | Status: DC
  Administered 2013-04-21: 18 [IU] via SUBCUTANEOUS
  Filled 2013-04-20: qty 10

## 2013-04-20 MED ORDER — ONDANSETRON HCL 4 MG PO TABS
4.0000 mg | ORAL_TABLET | Freq: Four times a day (QID) | ORAL | Status: DC | PRN
  Administered 2013-04-20: 21:00:00 4 mg via ORAL
  Filled 2013-04-20: qty 1

## 2013-04-20 MED ORDER — SODIUM CHLORIDE 0.9 % IV SOLN
INTRAVENOUS | Status: DC
  Administered 2013-04-20 (×2): via INTRAVENOUS

## 2013-04-20 MED ORDER — METRONIDAZOLE 500 MG PO TABS
2000.0000 mg | ORAL_TABLET | Freq: Once | ORAL | Status: AC
  Administered 2013-04-20: 19:00:00 2000 mg via ORAL
  Filled 2013-04-20: qty 4

## 2013-04-20 MED ORDER — LISINOPRIL 20 MG PO TABS
20.0000 mg | ORAL_TABLET | Freq: Every day | ORAL | Status: DC
  Administered 2013-04-21: 20 mg via ORAL
  Filled 2013-04-20: qty 1

## 2013-04-20 MED ORDER — ONDANSETRON HCL 4 MG/2ML IJ SOLN
4.0000 mg | Freq: Four times a day (QID) | INTRAMUSCULAR | Status: DC | PRN
  Administered 2013-04-21: 05:00:00 4 mg via INTRAVENOUS
  Filled 2013-04-20: qty 2

## 2013-04-20 MED ORDER — DICYCLOMINE HCL 20 MG PO TABS
20.0000 mg | ORAL_TABLET | Freq: Three times a day (TID) | ORAL | Status: DC
  Administered 2013-04-20 – 2013-04-21 (×3): 20 mg via ORAL
  Filled 2013-04-20 (×6): qty 1

## 2013-04-20 MED ORDER — IOHEXOL 300 MG/ML  SOLN
100.0000 mL | Freq: Once | INTRAMUSCULAR | Status: AC | PRN
  Administered 2013-04-20: 100 mL via INTRAVENOUS

## 2013-04-20 MED ORDER — MORPHINE SULFATE 2 MG/ML IJ SOLN
1.0000 mg | INTRAMUSCULAR | Status: DC | PRN
  Administered 2013-04-20 – 2013-04-21 (×2): 2 mg via INTRAVENOUS
  Filled 2013-04-20 (×2): qty 1

## 2013-04-20 MED ORDER — IOHEXOL 300 MG/ML  SOLN
50.0000 mL | Freq: Once | INTRAMUSCULAR | Status: AC | PRN
  Administered 2013-04-20: 50 mL via ORAL

## 2013-04-20 MED ORDER — MORPHINE SULFATE 4 MG/ML IJ SOLN
4.0000 mg | Freq: Once | INTRAMUSCULAR | Status: AC
  Administered 2013-04-20: 4 mg via INTRAVENOUS
  Filled 2013-04-20: qty 1

## 2013-04-20 MED ORDER — INSULIN ASPART 100 UNIT/ML ~~LOC~~ SOLN: 0.0000 [IU] | Freq: Every day | SUBCUTANEOUS | Status: DC

## 2013-04-20 MED ORDER — ENOXAPARIN SODIUM 40 MG/0.4ML ~~LOC~~ SOLN
40.0000 mg | SUBCUTANEOUS | Status: DC
  Administered 2013-04-20: 40 mg via SUBCUTANEOUS
  Filled 2013-04-20 (×2): qty 0.4

## 2013-04-20 MED ORDER — PANTOPRAZOLE SODIUM 40 MG PO TBEC
40.0000 mg | DELAYED_RELEASE_TABLET | Freq: Two times a day (BID) | ORAL | Status: DC
  Administered 2013-04-20 – 2013-04-21 (×2): 40 mg via ORAL
  Filled 2013-04-20 (×4): qty 1

## 2013-04-20 NOTE — H&P (Addendum)
[facility] History and Physical  Madeline Simpson UJW:[id number] DOB: 03-Aug-1961 DOA: 04/20/2013  Referring physician: Blinda Leatherwood PCP: Darrow Bussing, MD  Specialists:   Chief Complaint: Chest pain and dizziness  HPI: Madeline Simpson is a 52 y.o. female former smoker with past medical history significant for diabetes mellitus, hypertension, hyperlipidemia and IBS who presents with above complaints. She states that last p.m. she began having chest and dizziness which she attributed to working long hours, but this morning when she woke up she continued to have the pain. She describes the chest pain as sharp, intermittent, left precordial in location and 9/10 in intensity, lasting 1-2 minutes at a time. CP associated with SOB, movement, diaphoresis, nausea and vomiting x1. She also states she began having left arm paresthesias. Her colleague checked her blood pressure and it was our markedly elevated and so she decided to come to the ED. She reports that she's been out of all her blood pressure medications for the past couple of days. She also reports that she's been having abdominal cramping, followed by a BM and when her bowels move she gets relief. Patient was seen in the ED and EKG showed mild sinus tachycardia with no acute ischemic changes. Troponin was negative. Chest x-ray showed no acute findings. A CT scan of the abdomen and pelvis was done that showed a hepatic steatosis and hepatomegaly with no explanation for right upper quadrant pain noted. Her lipase was mildly elevated at 69 and urinalysis showed trichomonas. LFTs were within normal limits. She is admitted for further evaluation and management.  It is noted that pt had a stress test done by [facility] in 2013 and it showed no definite ischemia. Her CP was resolved in ED after SL NTG and morphine.   Review of Systems: The patient denies anorexia, fever, weight loss,, vision loss, decreased hearing, hoarseness, chest pain, syncope,  peripheral edema, balance deficits, hemoptysis, melena, muscle weakness, suspicious skin lesions, transient blindness, difficulty walking, depression, unusual weight change.   Past Medical History  Diagnosis Date  . Diabetes mellitus   . Hypertension   . Hyperlipidemia   . H/O tobacco use, presenting hazards to health   . Complication of anesthesia     "came out fighting, put back under aneth"   Past Surgical History  Procedure Laterality Date  . Cholecystectomy    . Abdominal hysterectomy      partial  . Knee surgery      right  . Hand surgery     Social History:  reports that she quit smoking about 2 years ago. Her smoking use included Cigarettes. She has a 25 pack-year smoking history. She has never used smokeless tobacco. She reports that she does not drink alcohol or use illicit drugs.  where does patient live--home Can patient participate in ADLs-yes  Allergies  Allergen Reactions  . Penicillins Anaphylaxis  . Apple Itching and Swelling  . Codeine Hives, Itching and Other (See Comments)    delirioius  . Naproxen Hives and Itching  . Shellfish Allergy Itching  . Ciprofloxacin Itching and Rash    Family History  Problem Relation Age of Onset  . Cerebral aneurysm Mother 28  . Hypertension Mother    Also +For DM and grandfather died of MI  Prior to Admission medications   Medication Sig Start Date End Date Taking? Authorizing Provider  acetaminophen (TYLENOL) 500 MG tablet Take 500 mg by mouth every 6 (six) hours as needed for pain.   Yes Historical Provider, MD  ibuprofen (ADVIL,MOTRIN) 200 MG tablet Take 200 mg by mouth every 6 (six) hours as needed for pain.   Yes Historical Provider, MD  insulin glargine (LANTUS) 100 UNIT/ML injection Inject into the skin at bedtime.   Yes Historical Provider, MD  insulin NPH (HUMULIN N,NOVOLIN N) 100 UNIT/ML injection Inject 18 Units into the skin 2 (two) times daily before a meal. 03/01/12 04/20/13 Yes Shanker Levora Dredge, MD   lisinopril (PRINIVIL,ZESTRIL) 20 MG tablet Take 20 mg by mouth daily.   Yes Historical Provider, MD  metFORMIN (GLUCOPHAGE) 500 MG tablet Take 1,000 mg by mouth 2 (two) times daily with a meal.   Yes Historical Provider, MD   Physical Exam: Filed Vitals:   04/20/13 1500  BP: 151/91  Pulse: 94  Temp:   Resp:     Constitutional: Vital signs reviewed.  Patient is a well-developed and well-nourished  in no acute distress and cooperative with exam. Alert and oriented x3.  Head: Normocephalic and atraumatic Nose: No erythema or drainage noted.   Mouth: no erythema or exudates, MMM Eyes: PERRL, EOMI, conjunctivae normal, No scleral icterus.  Neck: Supple, Trachea midline normal ROM, No JVD, mass, thyromegaly, or carotid bruit present.  Cardiovascular: RRR, S1 normal, S2 normal, no MRG, pulses symmetric and intact bilaterally Pulmonary/Chest: normal respiratory effort, CTAB, no wheezes, rales, or rhonchi. L chest wall reproducible tenderness present on exam Abdominal: Soft. Epigastric and RUQ tenderness present, non-distended, bowel sounds are normal, no masses, organomegaly, or guarding present.  GU: no CVA tenderness Extremities: No cyanosis and no edema Neurological: A&O x3, Strength is normal and symmetric bilaterally, cranial nerve II-XII are grossly intact, no focal motor deficit, sensory intact to light touch bilaterally.  Skin: Warm, dry and intact. No rash.  Psychiatric: Normal mood and affect. speech and behavior is normal    Labs on Admission:  Basic Metabolic Panel:  Recent Labs Lab 04/20/13 1010  NA 135  K 3.7  CL 97  CO2 22  GLUCOSE 380*  BUN 12  CREATININE 0.63  CALCIUM 10.0   Liver Function Tests:  Recent Labs Lab 04/20/13 1037  AST 14  ALT 20  ALKPHOS 61  BILITOT 0.7  PROT 7.3  ALBUMIN 4.2    Recent Labs Lab 04/20/13 1037  LIPASE 69*   No results found for this basename: AMMONIA,  in the last 168 hours CBC:  Recent Labs Lab 04/20/13 1010   WBC 8.2  NEUTROABS 5.0  HGB 14.5  HCT 40.7  MCV 85.3  PLT 372   Cardiac Enzymes:  Recent Labs Lab 04/20/13 1010  TROPONINI <0.30    BNP (last 3 results)  Recent Labs  04/20/13 1010  PROBNP 8.0   CBG: No results found for this basename: GLUCAP,  in the last 168 hours  Radiological Exams on Admission: Ct Abdomen Pelvis W Contrast  04/20/2013   *RADIOLOGY REPORT*  Clinical Data: Dizziness.  Chest and abdominal pain.  Shortness of breath.  Right upper quadrant pain with distention for 4 months. Diabetes.  CT ABDOMEN AND PELVIS WITH CONTRAST  Technique:  Multidetector CT imaging of the abdomen and pelvis was performed following the standard protocol during bolus administration of intravenous contrast.  Contrast: OMNIPAQUE IOHEXOL 300 MG/ML  SOLN, 50mL OMNIPAQUE IOHEXOL 300 MG/ML  SOLN  Comparison: 11/29/2012  Findings: Lung bases:  Clear lung bases.  Mild cardiomegaly, without pericardial or pleural effusion.  Abdomen/pelvis:  Hepatic steatosis, without focal liver lesion. More focal steatosis adjacent the falciform.  Hepatomegaly, 20.2  cm cranial caudal.  Normal spleen, stomach, pancreas, biliary tract.  Cholecystectomy.  Normal adrenal glands and kidneys.  No retroperitoneal or retrocrural adenopathy.  Aortic atherosclerosis.  Underdistended rectum and sigmoid. Scattered colonic diverticula. Normal terminal ileum and appendix.  Normal small bowel without abdominal ascites.    No pelvic adenopathy.    Normal urinary bladder.  Hysterectomy. No adnexal mass.  No significant free fluid.  Bones/Musculoskeletal:  Fat containing umbilical hernia, unchanged. Subtle findings suspicious for bilateral femoral head avascular necrosis.  No collapse.  IMPRESSION:  1.  Hepatic steatosis and hepatomegaly. 2.  No explanation for right upper quadrant pain. 3.  Subtle bilateral femoral head avascular necrosis.   Original Report Authenticated By: Jeronimo Greaves, M.D.   Dg Chest Port 1 View  04/20/2013    *RADIOLOGY REPORT*  Clinical Data: Left-sided chest pain with nausea and vomiting.  PORTABLE CHEST - 1 VIEW  Comparison: 10/10/2011  Findings: Lungs are clear.  The cardiomediastinal silhouette and remainder of the exam is unchanged.  IMPRESSION: No acute cardiopulmonary disease.   Original Report Authenticated By: Elberta Fortis, M.D.      Assessment/Plan Active Problems: Chest pain -Atypical and more likely Musculoskeletal given the reproducible L. chestwall tenderness on exam, but given her multiple risk factors as discussed above, eval for MI. Also her malignant HTN on admission due to noncompliance with meds likely contributing factor  -cycle troponins and follow, and consult cards if elevated -prn NTG, will hold off ASA for now given her allergy to NSAIDs -prn narcotics for pain management   Malignant HTN (hypertension) -secondary to non compliance and pain -BP improved in ED following SL NTG and pain control -resume outpt meds and follow  Abdominal  Pain with elevated Lipase of unclear sig./chemical pancreatitis -CT scan of  Neg for acute findings -possibly secondary to IBS given the h/o crampy abd pain relieved after BM, and she reports she was diagnosed with IBS 2-7yrs ago after having similar symptoms and colonoscopy done at the time(at [facility] per her report) was neg -start on Bentyl -her Lipase is mildly elevated but CT shows nl pancreas, she is s/p cholecystectomy as well>> clears, pain management, follow and recheck lipase in am -PPI, pain management and follow -will hold her metformin as well as it can cause abd pain.  DM (diabetes mellitus), type 2-uncontrolled -hold metformin as above, NPH, SSI  And follow Trichomonas Vaginitis -Trich in urine will treat with flagyl 2gx1 and follo -obtain urine culture as well, follow and further treat accordingly  h/o Hyperlipidemia -on no meds, check FLP         Code Status: full Family Communication: none at  bedside Disposition Plan:   Time spent: >79mins  Kela Millin [facility] Pager [phone]  If 7PM-7AM, please contact night-coverage [email or web address] Password [facility] 04/20/2013, 4:27 PM

## 2013-04-20 NOTE — ED Provider Notes (Signed)
CSN: [id number]     Arrival date & time 04/20/13  0945 History   First MD Initiated Contact with Patient 04/20/13 [phone]     Chief Complaint  Patient presents with  . Chest Pain  . Dizziness  . Shortness of Breath  . Nausea   (Consider location/radiation/quality/duration/timing/severity/associated sxs/prior Treatment) HPI Comments: Patient presents to the ER for evaluation of chest pain. Patient reports that she woke this morning with pain in the left breast area. Pain is constant, moderate to severe. Patient reports associated shortness of breath. She does have radiation of the pain to her left arm. She feels dizzy and has had nausea but no vomiting. Patient reports that she took her medications this morning including her blood pressure medication and Glucophage.  Patient reports having had a stress test in the past, but has no history of heart disease. She has multiple cardiac risk factors including family history, diabetes, hypertension, hyperlipidemia, history of tobacco use.  [name] presenting with chest pain and shortness of breath.  Chest Pain Associated symptoms: shortness of breath   Shortness of Breath Associated symptoms: chest pain     Past Medical History  Diagnosis Date  . Diabetes mellitus   . Hypertension   . Hyperlipidemia   . H/O tobacco use, presenting hazards to health   . Complication of anesthesia     "came out fighting, put back under aneth"   Past Surgical History  Procedure Laterality Date  . Cholecystectomy    . Abdominal hysterectomy      partial  . Knee surgery      right  . Hand surgery     Family History  Problem Relation Age of Onset  . Cerebral aneurysm Mother 68  . Hypertension Mother    History  Substance Use Topics  . Smoking status: Former Smoker -- 1.00 packs/day for 25 years    Types: Cigarettes    Quit date: 04/09/2011  . Smokeless tobacco: Never Used  . Alcohol Use: No   OB History   Grav Para Term Preterm  Abortions TAB SAB Ect Mult Living                 Review of Systems  Respiratory: Positive for shortness of breath.   Cardiovascular: Positive for chest pain.  All other systems reviewed and are negative.    Allergies  Penicillins; Apple; Codeine; Naproxen; Shellfish allergy; and Ciprofloxacin  Home Medications   Current Outpatient Rx  Name  Route  Sig  Dispense  Refill  . acetaminophen (TYLENOL) 500 MG tablet   Oral   Take 500 mg by mouth every 6 (six) hours as needed for pain.         Marland Kitchen ibuprofen (ADVIL,MOTRIN) 200 MG tablet   Oral   Take 200 mg by mouth every 6 (six) hours as needed for pain.         Marland Kitchen insulin glargine (LANTUS) 100 UNIT/ML injection   Subcutaneous   Inject into the skin at bedtime.         . insulin NPH (HUMULIN N,NOVOLIN N) 100 UNIT/ML injection   Subcutaneous   Inject 18 Units into the skin 2 (two) times daily before a meal.   1 vial   0   . lisinopril (PRINIVIL,ZESTRIL) 20 MG tablet   Oral   Take 20 mg by mouth daily.         . metFORMIN (GLUCOPHAGE) 500 MG tablet   Oral   Take  1,000 mg by mouth 2 (two) times daily with a meal.          BP 183/105  Pulse 98  Temp(Src) 98.1 F (36.7 C) (Oral)  Resp 20  SpO2 98% Physical Exam  Constitutional: She is oriented to person, place, and time. She appears well-developed and well-nourished. No distress.  HENT:  Head: Normocephalic and atraumatic.  Right Ear: Hearing normal.  Left Ear: Hearing normal.  Nose: Nose normal.  Mouth/Throat: Oropharynx is clear and moist and mucous membranes are normal.  Eyes: Conjunctivae and EOM are normal. Pupils are equal, round, and reactive to light.  Neck: Normal range of motion. Neck supple.  Cardiovascular: Regular rhythm, S1 normal and S2 normal.  Exam reveals no gallop and no friction rub.   No murmur heard. Pulmonary/Chest: Effort normal and breath sounds normal. No respiratory distress. She exhibits no tenderness.  Abdominal: Soft. Normal  appearance and bowel sounds are normal. There is no hepatosplenomegaly. There is no tenderness. There is no rebound, no guarding, no tenderness at McBurney's point and negative Murphy's sign. No hernia.  Musculoskeletal: Normal range of motion.  Neurological: She is alert and oriented to person, place, and time. She has normal strength. No cranial nerve deficit or sensory deficit. Coordination normal. GCS eye subscore is 4. GCS verbal subscore is 5. GCS motor subscore is 6.  Skin: Skin is warm, dry and intact. No rash noted. No cyanosis.  Psychiatric: Her speech is normal and behavior is normal. Thought content normal. Her mood appears anxious.    ED Course  Procedures (including critical care time)  EKG:  Date: 04/20/2013  Rate: 101  Rhythm: normal sinus rhythm  QRS Axis: normal  Intervals: normal  ST/T Wave abnormalities: normal  Conduction Disutrbances:none  Narrative Interpretation:   Old EKG Reviewed: unchanged   Labs Review Labs Reviewed  CBC WITH DIFFERENTIAL  BASIC METABOLIC PANEL  PRO B NATRIURETIC PEPTIDE  TROPONIN I   Imaging Review No results found.  MDM  Diagnosis: 1. Chest pain 2. Abdominal pain  The patient presented to ER for evaluation of chest pain. Patient has multiple cardiac risk factors. Cardiac evaluation performed. As far workup including EKG, troponin and BNP are negative. Blood pressure significantly improved with nitroglycerin. Because of her multitude of cardiac risk factors. She will require hospitalization for further management.  Patient did complain of abdominal pain after her initial evaluation. She says she thinks there is a mass in her abdomen. Patient has a large body habitus, no masses are palpated. Hepatic panel and lipase added on to the original workup. CAT scan of abdomen and pelvis was performed and no acute abnormalities are seen.      Gilda Crease, MD 04/21/13 [phone]

## 2013-04-20 NOTE — ED Notes (Signed)
Pt reports that her CP is better but she is having RUQ pain with nausea. Pt A&O and in NAD

## 2013-04-20 NOTE — ED Notes (Addendum)
Pt from home, reports that she started having CP, SOB, dizziness, N/V since 9am today. Pt states that she did not take her BP meds because she is out. Pt states that she felt disoriented,"like I didn't know where to go" while driving to ED.PT adds that she has been having RUQ abd pain with a "knot" and distention x4 mths. Pt is A&O and in NAD.

## 2013-04-20 NOTE — ED Notes (Signed)
Pt reports that CP is completely gone, only having abd pain

## 2013-04-20 NOTE — ED Notes (Signed)
Hospitalist at bedside 

## 2013-04-21 DIAGNOSIS — I10 Essential (primary) hypertension: Secondary | ICD-10-CM

## 2013-04-21 DIAGNOSIS — E785 Hyperlipidemia, unspecified: Secondary | ICD-10-CM

## 2013-04-21 LAB — GLUCOSE, CAPILLARY
Glucose-Capillary: 267 mg/dL — ABNORMAL HIGH (ref 70–99)
Glucose-Capillary: 281 mg/dL — ABNORMAL HIGH (ref 70–99)

## 2013-04-21 LAB — LIPASE, BLOOD: Lipase: 46 U/L (ref 11–59)

## 2013-04-21 LAB — CBC
HCT: 39.2 % (ref 36.0–46.0)
MCHC: 33.9 g/dL (ref 30.0–36.0)
Platelets: 315 10*3/uL (ref 150–400)
RDW: 13 % (ref 11.5–15.5)
WBC: 7.1 10*3/uL (ref 4.0–10.5)

## 2013-04-21 LAB — BASIC METABOLIC PANEL
BUN: 7 mg/dL (ref 6–23)
Calcium: 8.7 mg/dL (ref 8.4–10.5)
Chloride: 103 mEq/L (ref 96–112)
Creatinine, Ser: 0.64 mg/dL (ref 0.50–1.10)
GFR calc Af Amer: >90 mL/min (ref >90)
GFR calc non Af Amer: >90 mL/min (ref >90)

## 2013-04-21 LAB — LIPID PANEL: Cholesterol: 179 mg/dL (ref 0–200)

## 2013-04-21 LAB — TROPONIN I
Troponin I: <0.3 ng/mL (ref <0.30)
Troponin I: <0.3 ng/mL (ref <0.30)

## 2013-04-21 MED ORDER — POTASSIUM CHLORIDE CRYS ER 20 MEQ PO TBCR
40.0000 meq | EXTENDED_RELEASE_TABLET | ORAL | Status: DC
  Administered 2013-04-21: 40 meq via ORAL
  Filled 2013-04-21 (×2): qty 2

## 2013-04-21 MED ORDER — METFORMIN HCL 500 MG PO TABS: 1000.0000 mg | ORAL_TABLET | Freq: Two times a day (BID) | ORAL | Status: DC

## 2013-04-21 MED ORDER — LISINOPRIL 20 MG PO TABS: 20.0000 mg | ORAL_TABLET | Freq: Every day | ORAL | Status: DC

## 2013-04-21 MED ORDER — DICYCLOMINE HCL 20 MG PO TABS: 20.0000 mg | ORAL_TABLET | Freq: Three times a day (TID) | ORAL | Status: DC

## 2013-04-21 MED ORDER — FAMOTIDINE 20 MG PO TABS: 20.0000 mg | ORAL_TABLET | Freq: Two times a day (BID) | ORAL | Status: DC

## 2013-04-21 MED ORDER — GEMFIBROZIL 600 MG PO TABS: 600.0000 mg | ORAL_TABLET | Freq: Two times a day (BID) | ORAL | Status: DC

## 2013-04-21 NOTE — Progress Notes (Signed)
   CARE MANAGEMENT NOTE 04/21/2013  Patient:  Madeline Simpson, Madeline Simpson   Account Number:  [id number]  Date Initiated:  04/21/2013  Documentation initiated by:  [facility]  Subjective/Objective Assessment:   DM, Chest pain     Action/Plan:   Anticipated DC Date:  04/22/2013   Anticipated DC Plan:  HOME/SELF CARE      DC Planning Services  CM consult  Medication Assistance      Choice offered to / List presented to:             Status of service:  Completed, signed off Medicare Important Message given?   (If response is "NO", the following Medicare IM given date fields will be blank) Date Medicare IM given:   Date Additional Medicare IM given:    Discharge Disposition:  HOME/SELF CARE  Per UR Regulation:    If discussed at Long Length of Stay Meetings, dates discussed:    Comments:  04/21/2013 1200 NCM spoke to pt and states she had insurance to cover her medication. States she was on Novolog and medication was $150 after insurance covered. She did go to medication website to see if any assistance programs available to assist with cost. States her PCP was in process of sending a new Rx to her pharmacy that is cheaper for insulin. Isidoro Donning RN CCM Case Mgmt phone [phone]

## 2013-04-21 NOTE — Discharge Summary (Signed)
Physician Discharge Summary  Madeline Simpson:[id number] DOB: 18-Dec-1960 DOA: 04/20/2013  PCP: Darrow Bussing, MD  Admit date: 04/20/2013 Discharge date: 04/21/2013  Time spent: >23minutes  Recommendations for Outpatient Follow-up:  Follow-up Information   Follow up with Darrow Bussing, MD. (in 1week, call for appt upon discharge)    Specialty:  Family Medicine   Contact information:   [address] [place] [address] [phone]      Pending  Labs for follow up -urine culture  Discharge Diagnoses:  Active Problems:   HTN (hypertension)   DM (diabetes mellitus), type 2   Hyperlipidemia   Chest pain   Abdominal  pain, other specified site   Discharge Condition: improved/stable  Diet recommendation: modified carb  Filed Weights   04/20/13 1700  Weight: 97.977 kg (216 lb)    History of present illness:  Madeline Simpson is a 52 y.o. female former smoker with past medical history significant for diabetes mellitus, hypertension, hyperlipidemia and IBS who presents with above complaints. She states that last p.m. she began having chest and dizziness which she attributed to working long hours, but this morning when she woke up she continued to have the pain. She describes the chest pain as sharp, intermittent, left precordial in location and 9/10 in intensity, lasting 1-2 minutes at a time. CP associated with SOB, movement, diaphoresis, nausea and vomiting x1. She also states she began having left arm paresthesias. Her colleague checked her blood pressure and it was our markedly elevated and so she decided to come to the ED. She reports that she's been out of all her blood pressure medications for the past couple of days. She also reports that she's been having abdominal cramping, followed by a BM and when her bowels move she gets relief. Patient was seen in the ED and EKG showed mild sinus tachycardia with no acute ischemic changes. Troponin was negative. Chest x-ray showed  no acute findings. A CT scan of the abdomen and pelvis was done that showed a hepatic steatosis and hepatomegaly with no explanation for right upper quadrant pain noted. Her lipase was mildly elevated at 69 and urinalysis showed trichomonas. LFTs were within normal limits. She is admitted for further evaluation and management.  It is noted that pt had a stress test done by [facility] in 2013 and it showed no definite ischemia. Her CP was resolved in ED after SL NTG and morphine.   Hospital Course:  Chest pain  -Atypical and more likely Musculoskeletal given the reproducible L. chestwall tenderness on exam, but given her multiple risk factors as discussed above, eval for MI. Also her malignant HTN on admission due to noncompliance with meds likely contributing factor  - troponins were cycled and all came back neg -her CP is resolved and BP is controlled -she is medically stable for D/C at this time and is to follow up with PCP, and if recurrent may refer to cards outpt. Malignant HTN (hypertension)  -secondary to non compliance and pain  -BP better controlled, I have refilled her meds and she is to follow up with PCP Abdominal Pain with elevated Lipase of unclear sig./chemical pancreatitis  -CT scan of Neg for acute findings  -Most likely secondary to IBS given the h/o crampy abd pain relieved after BM, and she reports she was diagnosed with IBS 2-66yrs ago after having similar symptoms and colonoscopy done at the time(at [facility] per her report) was neg  -clinically improved on Bentyl  -Lipase down to wnl  today but CT shows nl pancreas, she is s/p cholecystectomy as well -her diet was advanced to solids and she is tolerating well, will d/c for outpt follow up with PCP.   DM (diabetes mellitus), type 2-uncontrolled  -Resume metformin and outpt insulin and follow up outpt with PCP Trichomonas Vaginitis  -S/P treatment with 2g of flagyl, follow up with PCP -Follow up with PCP for final  urine  culture  Hyperlipidemia  -elevated triglycerides, will d/c on lopid. Follow up with PCP Hypokalemia -k was replaced  Procedures:  none  Consultations:  none  Discharge Exam: Filed Vitals:   04/21/13 1300  BP: 134/84  Pulse: 93  Temp: 98.7 F (37.1 C)  Resp: 18    Exam:  General: alert & oriented x 3 In NAD Cardiovascular: RRR, nl S1 s2 Respiratory: CTAB Abdomen: soft +BS Decreased tenderness, ND, no masses palpable Extremities: No cyanosis and no edema    Discharge Instructions  Discharge Orders   Future Orders Complete By Expires   Diet Carb Modified  As directed    Increase activity slowly  As directed        Medication List         acetaminophen 500 MG tablet  Commonly known as:  TYLENOL  Take 500 mg by mouth every 6 (six) hours as needed for pain.     dicyclomine 20 MG tablet  Commonly known as:  BENTYL  Take 1 tablet (20 mg total) by mouth 4 (four) times daily -  before meals and at bedtime.     famotidine 20 MG tablet  Commonly known as:  PEPCID  Take 1 tablet (20 mg total) by mouth 2 (two) times daily.     ibuprofen 200 MG tablet  Commonly known as:  ADVIL,MOTRIN  Take 200 mg by mouth every 6 (six) hours as needed for pain.     insulin glargine 100 UNIT/ML injection  Commonly known as:  LANTUS  Inject into the skin at bedtime.     insulin NPH 100 UNIT/ML injection  Commonly known as:  HUMULIN N,NOVOLIN N  Inject 18 Units into the skin 2 (two) times daily before a meal.     lisinopril 20 MG tablet  Commonly known as:  PRINIVIL,ZESTRIL  Take 1 tablet (20 mg total) by mouth daily.     metFORMIN 500 MG tablet  Commonly known as:  GLUCOPHAGE  Take 1,000 mg by mouth 2 (two) times daily with a meal.      gemfibrozil 600MG  tablet Commonly known as: LOPID Take 1 tablet(600mg  total) by mouth 2(two)times daily before a meal   Allergies  Allergen Reactions  . Penicillins Anaphylaxis  . Apple Itching and Swelling  . Codeine Hives,  Itching and Other (See Comments)    delirioius  . Naproxen Hives and Itching  . Shellfish Allergy Itching  . Ciprofloxacin Itching and Rash       Follow-up Information   Follow up with Darrow Bussing, MD. (in 1week, call for appt upon discharge)    Specialty:  Family Medicine   Contact information:   [address] [place] [address] [phone]        The results of significant diagnostics from this hospitalization (including imaging, microbiology, ancillary and laboratory) are listed below for reference.    Significant Diagnostic Studies: Ct Abdomen Pelvis W Contrast  04/20/2013   *RADIOLOGY REPORT*  Clinical Data: Dizziness.  Chest and abdominal pain.  Shortness of breath.  Right upper quadrant pain with distention for  4 months. Diabetes.  CT ABDOMEN AND PELVIS WITH CONTRAST  Technique:  Multidetector CT imaging of the abdomen and pelvis was performed following the standard protocol during bolus administration of intravenous contrast.  Contrast: OMNIPAQUE IOHEXOL 300 MG/ML  SOLN, 50mL OMNIPAQUE IOHEXOL 300 MG/ML  SOLN  Comparison: 11/29/2012  Findings: Lung bases:  Clear lung bases.  Mild cardiomegaly, without pericardial or pleural effusion.  Abdomen/pelvis:  Hepatic steatosis, without focal liver lesion. More focal steatosis adjacent the falciform.  Hepatomegaly, 20.2 cm cranial caudal.  Normal spleen, stomach, pancreas, biliary tract.  Cholecystectomy.  Normal adrenal glands and kidneys.  No retroperitoneal or retrocrural adenopathy.  Aortic atherosclerosis.  Underdistended rectum and sigmoid. Scattered colonic diverticula. Normal terminal ileum and appendix.  Normal small bowel without abdominal ascites.    No pelvic adenopathy.    Normal urinary bladder.  Hysterectomy. No adnexal mass.  No significant free fluid.  Bones/Musculoskeletal:  Fat containing umbilical hernia, unchanged. Subtle findings suspicious for bilateral femoral head avascular necrosis.  No collapse.   IMPRESSION:  1.  Hepatic steatosis and hepatomegaly. 2.  No explanation for right upper quadrant pain. 3.  Subtle bilateral femoral head avascular necrosis.   Original Report Authenticated By: Jeronimo Greaves, M.D.   Dg Chest Port 1 View  04/20/2013   *RADIOLOGY REPORT*  Clinical Data: Left-sided chest pain with nausea and vomiting.  PORTABLE CHEST - 1 VIEW  Comparison: 10/10/2011  Findings: Lungs are clear.  The cardiomediastinal silhouette and remainder of the exam is unchanged.  IMPRESSION: No acute cardiopulmonary disease.   Original Report Authenticated By: Elberta Fortis, M.D.    Microbiology: No results found for this or any previous visit (from the past 240 hour(s)).   Labs: Basic Metabolic Panel:  Recent Labs Lab 04/20/13 1010 04/21/13 0505  NA 135 138  K 3.7 3.3*  CL 97 103  CO2 22 23  GLUCOSE 380* 248*  BUN 12 7  CREATININE 0.63 0.64  CALCIUM 10.0 8.7   Liver Function Tests:  Recent Labs Lab 04/20/13 1037  AST 14  ALT 20  ALKPHOS 61  BILITOT 0.7  PROT 7.3  ALBUMIN 4.2    Recent Labs Lab 04/20/13 1037 04/21/13 0505  LIPASE 69* 46   No results found for this basename: AMMONIA,  in the last 168 hours CBC:  Recent Labs Lab 04/20/13 1010 04/21/13 0505  WBC 8.2 7.1  NEUTROABS 5.0  --   HGB 14.5 13.3  HCT 40.7 39.2  MCV 85.3 86.3  PLT 372 315   Cardiac Enzymes:  Recent Labs Lab 04/20/13 1010 04/20/13 1746 04/20/13 2312 04/21/13 0505  TROPONINI <0.30 <0.30 <0.30 <0.30   BNP: BNP (last 3 results)  Recent Labs  04/20/13 1010  PROBNP 8.0   CBG:  Recent Labs Lab 04/20/13 1707 04/20/13 2237 04/21/13 0729 04/21/13 1206  GLUCAP 135* 271* 267* 281*       Signed:  Javier Mamone C  [facility] 04/21/2013, 3:13 PM

## 2013-04-23 LAB — URINE CULTURE

## 2013-10-28 ENCOUNTER — Other Ambulatory Visit (HOSPITAL_COMMUNITY): Payer: Self-pay | Admitting: Family Medicine

## 2013-10-28 DIAGNOSIS — N644 Mastodynia: Secondary | ICD-10-CM

## 2013-11-04 ENCOUNTER — Encounter (HOSPITAL_COMMUNITY): Payer: Self-pay

## 2013-11-04 ENCOUNTER — Other Ambulatory Visit (HOSPITAL_COMMUNITY): Payer: Self-pay | Admitting: Family Medicine

## 2013-11-04 ENCOUNTER — Ambulatory Visit (HOSPITAL_COMMUNITY)
Admission: RE | Admit: 2013-11-04 | Discharge: 2013-11-04 | Disposition: A | Discharge status: Home / Self Care | Payer: BC Managed Care – PPO | Source: Ambulatory Visit | Attending: Family Medicine | Admitting: Family Medicine

## 2013-11-04 DIAGNOSIS — N949 Unspecified condition associated with female genital organs and menstrual cycle: Secondary | ICD-10-CM | POA: Insufficient documentation

## 2013-11-04 DIAGNOSIS — R1031 Right lower quadrant pain: Secondary | ICD-10-CM

## 2013-11-04 DIAGNOSIS — K7689 Other specified diseases of liver: Secondary | ICD-10-CM | POA: Insufficient documentation

## 2013-11-04 DIAGNOSIS — K5730 Diverticulosis of large intestine without perforation or abscess without bleeding: Secondary | ICD-10-CM | POA: Insufficient documentation

## 2013-11-04 DIAGNOSIS — R109 Unspecified abdominal pain: Secondary | ICD-10-CM | POA: Insufficient documentation

## 2013-11-04 DIAGNOSIS — K429 Umbilical hernia without obstruction or gangrene: Secondary | ICD-10-CM | POA: Insufficient documentation

## 2013-11-04 MED ORDER — IOHEXOL 300 MG/ML  SOLN
50.0000 mL | Freq: Once | INTRAMUSCULAR | Status: AC | PRN
  Administered 2013-11-04: 50 mL via ORAL

## 2013-11-04 MED ORDER — IOHEXOL 300 MG/ML  SOLN
100.0000 mL | Freq: Once | INTRAMUSCULAR | Status: AC | PRN
  Administered 2013-11-04: 100 mL via INTRAVENOUS

## 2013-11-06 ENCOUNTER — Ambulatory Visit (HOSPITAL_COMMUNITY)
Admission: RE | Admit: 2013-11-06 | Discharge: 2013-11-06 | Disposition: A | Discharge status: Home / Self Care | Payer: BC Managed Care – PPO | Source: Ambulatory Visit | Attending: Family Medicine | Admitting: Family Medicine

## 2013-11-06 ENCOUNTER — Other Ambulatory Visit (HOSPITAL_COMMUNITY): Payer: Self-pay | Admitting: Family Medicine

## 2013-11-06 ENCOUNTER — Other Ambulatory Visit: Payer: Self-pay | Admitting: Family Medicine

## 2013-11-06 DIAGNOSIS — N644 Mastodynia: Secondary | ICD-10-CM

## 2013-11-06 DIAGNOSIS — R920 Mammographic microcalcification found on diagnostic imaging of breast: Secondary | ICD-10-CM | POA: Insufficient documentation

## 2013-11-06 DIAGNOSIS — N63 Unspecified lump in unspecified breast: Secondary | ICD-10-CM

## 2013-11-06 DIAGNOSIS — R921 Mammographic calcification found on diagnostic imaging of breast: Secondary | ICD-10-CM

## 2013-11-13 ENCOUNTER — Other Ambulatory Visit (HOSPITAL_COMMUNITY): Payer: BC Managed Care – PPO

## 2013-11-14 ENCOUNTER — Ambulatory Visit
Admission: RE | Admit: 2013-11-14 | Discharge: 2013-11-14 | Disposition: A | Discharge status: Home / Self Care | Payer: BC Managed Care – PPO | Source: Ambulatory Visit | Attending: Family Medicine | Admitting: Family Medicine

## 2013-11-14 DIAGNOSIS — R921 Mammographic calcification found on diagnostic imaging of breast: Secondary | ICD-10-CM

## 2013-11-30 ENCOUNTER — Encounter (HOSPITAL_COMMUNITY): Payer: Self-pay | Admitting: Emergency Medicine

## 2013-11-30 ENCOUNTER — Emergency Department (HOSPITAL_COMMUNITY)
Admission: EM | Admit: 2013-11-30 | Discharge: 2013-11-30 | Disposition: A | Discharge status: Home / Self Care | Payer: BC Managed Care – PPO | Attending: Emergency Medicine | Admitting: Emergency Medicine

## 2013-11-30 DIAGNOSIS — Z9089 Acquired absence of other organs: Secondary | ICD-10-CM | POA: Insufficient documentation

## 2013-11-30 DIAGNOSIS — Z79899 Other long term (current) drug therapy: Secondary | ICD-10-CM | POA: Insufficient documentation

## 2013-11-30 DIAGNOSIS — R109 Unspecified abdominal pain: Secondary | ICD-10-CM

## 2013-11-30 DIAGNOSIS — R112 Nausea with vomiting, unspecified: Secondary | ICD-10-CM | POA: Insufficient documentation

## 2013-11-30 DIAGNOSIS — Z87891 Personal history of nicotine dependence: Secondary | ICD-10-CM | POA: Insufficient documentation

## 2013-11-30 DIAGNOSIS — Z90710 Acquired absence of both cervix and uterus: Secondary | ICD-10-CM | POA: Insufficient documentation

## 2013-11-30 DIAGNOSIS — K602 Anal fissure, unspecified: Secondary | ICD-10-CM | POA: Insufficient documentation

## 2013-11-30 DIAGNOSIS — K589 Irritable bowel syndrome without diarrhea: Secondary | ICD-10-CM | POA: Insufficient documentation

## 2013-11-30 DIAGNOSIS — R1084 Generalized abdominal pain: Secondary | ICD-10-CM | POA: Insufficient documentation

## 2013-11-30 DIAGNOSIS — E119 Type 2 diabetes mellitus without complications: Secondary | ICD-10-CM | POA: Insufficient documentation

## 2013-11-30 DIAGNOSIS — I10 Essential (primary) hypertension: Secondary | ICD-10-CM | POA: Insufficient documentation

## 2013-11-30 DIAGNOSIS — Z880 Allergy status to penicillin: Secondary | ICD-10-CM | POA: Insufficient documentation

## 2013-11-30 DIAGNOSIS — Z794 Long term (current) use of insulin: Secondary | ICD-10-CM | POA: Insufficient documentation

## 2013-11-30 DIAGNOSIS — K625 Hemorrhage of anus and rectum: Secondary | ICD-10-CM

## 2013-11-30 DIAGNOSIS — K644 Residual hemorrhoidal skin tags: Secondary | ICD-10-CM | POA: Insufficient documentation

## 2013-11-30 LAB — URINALYSIS, ROUTINE W REFLEX MICROSCOPIC
Bilirubin Urine: NEGATIVE
GLUCOSE, UA: 250 mg/dL — AB
HGB URINE DIPSTICK: NEGATIVE
Ketones, ur: NEGATIVE mg/dL
Leukocytes, UA: NEGATIVE
Nitrite: NEGATIVE
PH: 5.5 (ref 5.0–8.0)
Protein, ur: 100 mg/dL — AB
Specific Gravity, Urine: 1.028 (ref 1.005–1.030)
Urobilinogen, UA: 1 mg/dL (ref 0.0–1.0)

## 2013-11-30 LAB — CBC
HCT: 36.7 % (ref 36.0–46.0)
HEMOGLOBIN: 12.6 g/dL (ref 12.0–15.0)
MCH: 29.7 pg (ref 26.0–34.0)
MCHC: 34.3 g/dL (ref 30.0–36.0)
MCV: 86.6 fL (ref 78.0–100.0)
PLATELETS: 378 10*3/uL (ref 150–400)
RBC: 4.24 MIL/uL (ref 3.87–5.11)
RDW: 13.3 % (ref 11.5–15.5)
WBC: 11 10*3/uL — ABNORMAL HIGH (ref 4.0–10.5)

## 2013-11-30 LAB — URINE MICROSCOPIC-ADD ON

## 2013-11-30 LAB — COMPREHENSIVE METABOLIC PANEL
ALK PHOS: 56 U/L (ref 39–117)
ALT: 22 U/L (ref 0–35)
AST: 18 U/L (ref 0–37)
Albumin: 3.9 g/dL (ref 3.5–5.2)
BUN: 11 mg/dL (ref 6–23)
CALCIUM: 10 mg/dL (ref 8.4–10.5)
CO2: 26 mEq/L (ref 19–32)
Chloride: 99 mEq/L (ref 96–112)
Creatinine, Ser: 0.96 mg/dL (ref 0.50–1.10)
GFR calc Af Amer: 79 mL/min — ABNORMAL LOW (ref >90)
GFR calc non Af Amer: 68 mL/min — ABNORMAL LOW (ref >90)
Glucose, Bld: 175 mg/dL — ABNORMAL HIGH (ref 70–99)
POTASSIUM: 3.9 meq/L (ref 3.7–5.3)
SODIUM: 140 meq/L (ref 137–147)
TOTAL PROTEIN: 7.1 g/dL (ref 6.0–8.3)
Total Bilirubin: 0.7 mg/dL (ref 0.3–1.2)

## 2013-11-30 LAB — LIPASE, BLOOD: Lipase: 61 U/L — ABNORMAL HIGH (ref 11–59)

## 2013-11-30 LAB — POC OCCULT BLOOD, ED: Fecal Occult Bld: POSITIVE — AB

## 2013-11-30 MED ORDER — ONDANSETRON HCL 4 MG/2ML IJ SOLN
4.0000 mg | Freq: Once | INTRAMUSCULAR | Status: AC
  Administered 2013-11-30: 4 mg via INTRAVENOUS
  Filled 2013-11-30: qty 2

## 2013-11-30 MED ORDER — DICYCLOMINE HCL 10 MG PO CAPS
10.0000 mg | ORAL_CAPSULE | Freq: Once | ORAL | Status: AC
  Administered 2013-11-30: 10 mg via ORAL
  Filled 2013-11-30: qty 1

## 2013-11-30 MED ORDER — MORPHINE SULFATE 2 MG/ML IJ SOLN
2.0000 mg | Freq: Once | INTRAMUSCULAR | Status: AC
  Administered 2013-11-30: 2 mg via INTRAVENOUS
  Filled 2013-11-30: qty 1

## 2013-11-30 MED ORDER — HYDROCODONE-ACETAMINOPHEN 5-325 MG PO TABS: 1.0000 | ORAL_TABLET | ORAL | Status: DC | PRN

## 2013-11-30 MED ORDER — SODIUM CHLORIDE 0.9 % IV BOLUS (SEPSIS)
1000.0000 mL | Freq: Once | INTRAVENOUS | Status: AC
  Administered 2013-11-30: 1000 mL via INTRAVENOUS

## 2013-11-30 MED ORDER — POLYETHYLENE GLYCOL 3350 17 GM/SCOOP PO POWD: 17.0000 g | Freq: Every day | ORAL | Status: DC

## 2013-11-30 MED ORDER — MORPHINE SULFATE 4 MG/ML IJ SOLN
4.0000 mg | Freq: Once | INTRAMUSCULAR | Status: AC
  Administered 2013-11-30: 4 mg via INTRAVENOUS
  Filled 2013-11-30: qty 1

## 2013-11-30 NOTE — ED Notes (Signed)
Pt c/o of bright red blood in stool since Sunday. She reports that her abdomen is distended and has a knot on the right upper side. Last BM was this am in which it was "runny". She also states the she vomited 2 this am after her Bm

## 2013-11-30 NOTE — ED Notes (Signed)
Pt ambulated with steady gait

## 2013-11-30 NOTE — ED Notes (Signed)
Ginger Ale and crackers provided for PO challenge.

## 2013-11-30 NOTE — Discharge Instructions (Signed)
Call for a follow up appointment with a Family or Primary Care Provider.  Call Dr. Janace Hoard for further evaluation of your Anal fissure and hemorrhoids. Call [facility] for further evaluation of your abdominal pain.  Return if Symptoms worsen.   Take medication as prescribed.  Sitz baths 15 min three times a day, stool softener, plenty of fluids, and make sure you are eating fruits and vegetables and extra water to avoid constipation.  Narcotics can cause constipation.
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
## 2013-11-30 NOTE — ED Notes (Signed)
Attempted IV insertion. Pt pulling away during stick. Another RN to attempt.

## 2013-11-30 NOTE — ED Notes (Signed)
PA at bedside.

## 2013-11-30 NOTE — ED Notes (Signed)
Pt c/o gen abd pain and BRB per rectum x 9 years.  States worse in the last week.

## 2013-12-02 NOTE — ED Provider Notes (Signed)
Medical screening examination/treatment/procedure(s) were performed by non-physician practitioner and as supervising physician I was immediately available for consultation/collaboration.   EKG Interpretation None        Neta Ehlers, MD 12/02/13 1002

## 2013-12-03 ENCOUNTER — Ambulatory Visit (INDEPENDENT_AMBULATORY_CARE_PROVIDER_SITE_OTHER): Payer: BC Managed Care – PPO | Admitting: General Surgery

## 2013-12-09 ENCOUNTER — Encounter (INDEPENDENT_AMBULATORY_CARE_PROVIDER_SITE_OTHER): Payer: Self-pay | Admitting: General Surgery

## 2013-12-09 ENCOUNTER — Ambulatory Visit (INDEPENDENT_AMBULATORY_CARE_PROVIDER_SITE_OTHER): Payer: BC Managed Care – PPO | Admitting: General Surgery

## 2013-12-09 VITALS — BP 160/90 | HR 80 | Temp 97.2°F | Ht 67.0 in | Wt 216.2 lb

## 2013-12-09 DIAGNOSIS — R921 Mammographic calcification found on diagnostic imaging of breast: Secondary | ICD-10-CM

## 2013-12-09 DIAGNOSIS — R928 Other abnormal and inconclusive findings on diagnostic imaging of breast: Secondary | ICD-10-CM
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
## 2013-12-18 ENCOUNTER — Encounter (HOSPITAL_COMMUNITY): Payer: Self-pay | Admitting: Pharmacy Technician

## 2013-12-18 ENCOUNTER — Other Ambulatory Visit (INDEPENDENT_AMBULATORY_CARE_PROVIDER_SITE_OTHER): Payer: Self-pay | Admitting: General Surgery

## 2013-12-18 DIAGNOSIS — R921 Mammographic calcification found on diagnostic imaging of breast: Secondary | ICD-10-CM

## 2013-12-21 NOTE — Pre-Procedure Instructions (Signed)
Madeline Simpson  12/21/2013   Your procedure is scheduled on:  May 14  Report to [place] at Immediately after [facility] Appointment, Bring X-Rays and paperwork with you to Short Stay from the [facility]   Call this number if you have problems the morning of surgery: [phone]   Remember:   Do not eat food or drink liquids after midnight.   Take these medicines the morning of surgery with A SIP OF WATER: Prozac, Allegra, Flonase, Hydrocodone (if needed), Visine   STOP Garcinia Cambogia, Fish Oil, Green Tea Extract, Aspirin today   STOP/ Do not take Aspirin, Aleve, Naproxen, Advil, Ibuprofen, Vitamin, Herbs, or Supplements starting today   Do not wear jewelry, make-up or nail polish.  Do not wear lotions, powders, or perfumes. You may wear deodorant.  Do not shave 48 hours prior to surgery. Men may shave face and neck.  Do not bring valuables to the hospital.  [facility] is not responsible for any belongings or valuables.               Contacts, dentures or bridgework may not be worn into surgery.  Leave suitcase in the car. After surgery it may be brought to your room.  For patients admitted to the hospital, discharge time is determined by your treatment team.               Patients discharged the day of surgery will not be allowed to drive home.  Name and phone number of your driver: Family/ Friend  Special Instructions: See [place] [facility] Preparing For Surgery   Please read over the following fact sheets that you were given: Pain Booklet, Coughing and Deep Breathing and Surgical Site Infection Prevention

## 2013-12-21 NOTE — Pre-Procedure Instructions (Signed)
[place] - Preparing for Surgery  Before surgery, you can play an important role.  Because skin is not sterile, your skin needs to be as free of germs as possible.  You can reduce the number of germs on you skin by washing with CHG (chlorahexidine gluconate) soap before surgery.  CHG is an antiseptic cleaner which kills germs and bonds with the skin to continue killing germs even after washing.  Please DO NOT use if you have an allergy to CHG or antibacterial soaps.  If your skin becomes reddened/irritated stop using the CHG and inform your nurse when you arrive at Short Stay.  Do not shave (including legs and underarms) for at least 48 hours prior to the first CHG shower.  You may shave your face.  Please follow these instructions carefully:   1.  Shower with CHG Soap the night before surgery and the morning of Surgery.  2.  If you choose to wash your hair, wash your hair first as usual with your normal shampoo.  3.  After you shampoo, rinse your hair and body thoroughly to remove the shampoo.  4.  Use CHG as you would any other liquid soap.  You can apply CHG directly to the skin and wash gently with scrungie or a clean washcloth.  5.  Apply the CHG Soap to your body ONLY FROM THE NECK DOWN.  Do not use on open wounds or open sores.  Avoid contact with your eyes, ears, mouth and genitals (private parts).  Wash genitals (private parts) with your normal soap.  6.  Wash thoroughly, paying special attention to the area where your surgery will be performed.  7.  Thoroughly rinse your body with warm water from the neck down.  8.  DO NOT shower/wash with your normal soap after using and rinsing off the CHG Soap.  9.  Pat yourself dry with a clean towel.            10.  Wear clean pajamas.            11.  Place clean sheets on your bed the night of your first shower and do not sleep with pets.  Day of Surgery  Do not apply any lotions the morning of surgery.  Please wear clean clothes to the  hospital/surgery center.   

## 2013-12-23 ENCOUNTER — Encounter (HOSPITAL_COMMUNITY)
Admission: RE | Admit: 2013-12-23 | Discharge: 2013-12-23 | Disposition: A | Discharge status: Home / Self Care | Payer: BC Managed Care – PPO | Source: Ambulatory Visit | Attending: General Surgery | Admitting: General Surgery

## 2013-12-23 ENCOUNTER — Encounter (HOSPITAL_COMMUNITY): Payer: Self-pay

## 2013-12-23 DIAGNOSIS — I10 Essential (primary) hypertension: Secondary | ICD-10-CM | POA: Insufficient documentation

## 2013-12-23 DIAGNOSIS — Z01812 Encounter for preprocedural laboratory examination: Secondary | ICD-10-CM | POA: Insufficient documentation

## 2013-12-23 DIAGNOSIS — Z01818 Encounter for other preprocedural examination: Secondary | ICD-10-CM | POA: Insufficient documentation

## 2013-12-23 HISTORY — DX: Irritable bowel syndrome, unspecified: K58.9

## 2013-12-23 HISTORY — DX: Melena: K92.1

## 2013-12-23 HISTORY — DX: Gastro-esophageal reflux disease without esophagitis: K21.9

## 2013-12-23 HISTORY — DX: Unspecified convulsions: R56.9

## 2013-12-23 HISTORY — DX: Anal fissure, unspecified: K60.2

## 2013-12-23 LAB — BASIC METABOLIC PANEL
BUN: 14 mg/dL (ref 6–23)
CALCIUM: 9.4 mg/dL (ref 8.4–10.5)
CHLORIDE: 101 meq/L (ref 96–112)
CO2: 23 mEq/L (ref 19–32)
Creatinine, Ser: 0.81 mg/dL (ref 0.50–1.10)
GFR calc Af Amer: >90 mL/min (ref >90)
GFR calc non Af Amer: 83 mL/min — ABNORMAL LOW (ref >90)
Glucose, Bld: 232 mg/dL — ABNORMAL HIGH (ref 70–99)
Potassium: 4 mEq/L (ref 3.7–5.3)
Sodium: 139 mEq/L (ref 137–147)

## 2013-12-23 LAB — CBC
HEMATOCRIT: 39.8 % (ref 36.0–46.0)
Hemoglobin: 13.4 g/dL (ref 12.0–15.0)
MCH: 29.8 pg (ref 26.0–34.0)
MCHC: 33.7 g/dL (ref 30.0–36.0)
MCV: 88.6 fL (ref 78.0–100.0)
Platelets: 404 10*3/uL — ABNORMAL HIGH (ref 150–400)
RBC: 4.49 MIL/uL (ref 3.87–5.11)
RDW: 13.4 % (ref 11.5–15.5)
WBC: 10.2 10*3/uL (ref 4.0–10.5)

## 2013-12-23 NOTE — Progress Notes (Signed)
REQUESTED LAST EKG, OV FROM [facility].

## 2013-12-25 MED ORDER — VANCOMYCIN HCL IN DEXTROSE 1-5 GM/200ML-% IV SOLN: 1000.0000 mg | INTRAVENOUS | Status: AC

## 2013-12-26 ENCOUNTER — Encounter (HOSPITAL_COMMUNITY): Payer: Self-pay | Admitting: Critical Care Medicine

## 2013-12-26 ENCOUNTER — Ambulatory Visit
Admission: RE | Admit: 2013-12-26 | Discharge: 2013-12-26 | Disposition: A | Discharge status: Home / Self Care | Payer: BC Managed Care – PPO | Source: Ambulatory Visit | Attending: General Surgery | Admitting: General Surgery

## 2013-12-26 DIAGNOSIS — R921 Mammographic calcification found on diagnostic imaging of breast: Secondary | ICD-10-CM

## 2013-12-26 MED ORDER — FENTANYL CITRATE 0.05 MG/ML IJ SOLN
INTRAMUSCULAR | Status: AC
  Filled 2013-12-26: qty 5

## 2013-12-26 MED ORDER — MIDAZOLAM HCL 2 MG/2ML IJ SOLN
INTRAMUSCULAR | Status: AC
  Filled 2013-12-26: qty 2

## 2013-12-26 MED ORDER — LIDOCAINE HCL (CARDIAC) 20 MG/ML IV SOLN
INTRAVENOUS | Status: AC
  Filled 2013-12-26: qty 5

## 2013-12-26 NOTE — Anesthesia Preprocedure Evaluation (Addendum)
Anesthesia Evaluation  Patient identified by MRN, date of birth, ID band Patient awake    Reviewed: Allergy & Precautions, H&P , NPO status , Patient's Chart, lab work & pertinent test results  Airway Mallampati: II      Dental  (+) Dental Advisory Given   Pulmonary former smoker,  breath sounds clear to auscultation        Cardiovascular hypertension, Pt. on medications Rhythm:Regular Rate:Normal     Neuro/Psych Seizures -,     GI/Hepatic GERD-  ,  Endo/Other  diabetes  Renal/GU Renal disease     Musculoskeletal   Abdominal   Peds  Hematology   Anesthesia Other Findings   Reproductive/Obstetrics                        Anesthesia Physical Anesthesia Plan  ASA: III  Anesthesia Plan:    Post-op Pain Management:    Induction: Intravenous  Airway Management Planned: LMA  Additional Equipment:   Intra-op Plan:   Post-operative Plan: Extubation in OR  Informed Consent: I have reviewed the patients History and Physical, chart, labs and discussed the procedure including the risks, benefits and alternatives for the proposed anesthesia with the patient or authorized representative who has indicated his/her understanding and acceptance.   Dental advisory given  Plan Discussed with:   Anesthesia Plan Comments:         Anesthesia Quick Evaluation

## 2014-01-01 MED ORDER — CHLORHEXIDINE GLUCONATE 4 % EX LIQD
1.0000 "application " | Freq: Once | CUTANEOUS | Status: DC
  Filled 2014-01-01: qty 15

## 2014-01-02 ENCOUNTER — Ambulatory Visit
Admission: RE | Admit: 2014-01-02 | Discharge: 2014-01-02 | Disposition: A | Discharge status: Home / Self Care | Payer: BC Managed Care – PPO | Source: Ambulatory Visit | Attending: General Surgery | Admitting: General Surgery

## 2014-01-02 ENCOUNTER — Encounter (HOSPITAL_COMMUNITY): Payer: Self-pay | Admitting: *Deleted

## 2014-01-02 ENCOUNTER — Encounter (HOSPITAL_COMMUNITY): Payer: BC Managed Care – PPO | Admitting: Critical Care Medicine

## 2014-01-02 ENCOUNTER — Encounter (INDEPENDENT_AMBULATORY_CARE_PROVIDER_SITE_OTHER): Payer: Self-pay

## 2014-01-02 ENCOUNTER — Ambulatory Visit (HOSPITAL_COMMUNITY)
Admission: RE | Admit: 2014-01-02 | Discharge: 2014-01-02 | Disposition: A | Discharge status: Home / Self Care | Payer: BC Managed Care – PPO | Source: Ambulatory Visit | Attending: General Surgery | Admitting: General Surgery

## 2014-01-02 ENCOUNTER — Other Ambulatory Visit (INDEPENDENT_AMBULATORY_CARE_PROVIDER_SITE_OTHER): Payer: Self-pay | Admitting: General Surgery

## 2014-01-02 ENCOUNTER — Encounter (HOSPITAL_COMMUNITY)
Admission: RE | Disposition: A | Discharge status: Home / Self Care | Payer: Self-pay | Source: Ambulatory Visit | Attending: General Surgery

## 2014-01-02 ENCOUNTER — Ambulatory Visit (HOSPITAL_COMMUNITY): Payer: BC Managed Care – PPO | Admitting: Critical Care Medicine

## 2014-01-02 DIAGNOSIS — Z91013 Allergy to seafood: Secondary | ICD-10-CM | POA: Insufficient documentation

## 2014-01-02 DIAGNOSIS — N6089 Other benign mammary dysplasias of unspecified breast: Secondary | ICD-10-CM

## 2014-01-02 DIAGNOSIS — Z91018 Allergy to other foods: Secondary | ICD-10-CM | POA: Insufficient documentation

## 2014-01-02 DIAGNOSIS — E119 Type 2 diabetes mellitus without complications: Secondary | ICD-10-CM | POA: Insufficient documentation

## 2014-01-02 DIAGNOSIS — R920 Mammographic microcalcification found on diagnostic imaging of breast: Secondary | ICD-10-CM

## 2014-01-02 DIAGNOSIS — E785 Hyperlipidemia, unspecified: Secondary | ICD-10-CM | POA: Insufficient documentation

## 2014-01-02 DIAGNOSIS — R921 Mammographic calcification found on diagnostic imaging of breast: Secondary | ICD-10-CM

## 2014-01-02 DIAGNOSIS — Z7982 Long term (current) use of aspirin: Secondary | ICD-10-CM | POA: Insufficient documentation

## 2014-01-02 DIAGNOSIS — I10 Essential (primary) hypertension: Secondary | ICD-10-CM | POA: Insufficient documentation

## 2014-01-02 DIAGNOSIS — Z881 Allergy status to other antibiotic agents status: Secondary | ICD-10-CM | POA: Insufficient documentation

## 2014-01-02 DIAGNOSIS — Z880 Allergy status to penicillin: Secondary | ICD-10-CM | POA: Insufficient documentation

## 2014-01-02 DIAGNOSIS — N62 Hypertrophy of breast: Secondary | ICD-10-CM | POA: Insufficient documentation

## 2014-01-02 DIAGNOSIS — R928 Other abnormal and inconclusive findings on diagnostic imaging of breast: Secondary | ICD-10-CM | POA: Insufficient documentation

## 2014-01-02 DIAGNOSIS — Z79899 Other long term (current) drug therapy: Secondary | ICD-10-CM | POA: Insufficient documentation

## 2014-01-02 DIAGNOSIS — Z886 Allergy status to analgesic agent status: Secondary | ICD-10-CM | POA: Insufficient documentation

## 2014-01-02 DIAGNOSIS — Z794 Long term (current) use of insulin: Secondary | ICD-10-CM | POA: Insufficient documentation

## 2014-01-02 DIAGNOSIS — Z885 Allergy status to narcotic agent status: Secondary | ICD-10-CM | POA: Insufficient documentation

## 2014-01-02 DIAGNOSIS — Z87891 Personal history of nicotine dependence: Secondary | ICD-10-CM | POA: Insufficient documentation

## 2014-01-02 HISTORY — PX: BREAST EXCISIONAL BIOPSY: SUR124

## 2014-01-02 HISTORY — PX: BREAST LUMPECTOMY WITH NEEDLE LOCALIZATION: SHX5759

## 2014-01-02 LAB — GLUCOSE, CAPILLARY
Glucose-Capillary: 114 mg/dL — ABNORMAL HIGH (ref 70–99)
Glucose-Capillary: 146 mg/dL — ABNORMAL HIGH (ref 70–99)

## 2014-01-02 SURGERY — BREAST LUMPECTOMY WITH NEEDLE LOCALIZATION
Anesthesia: General | Site: Breast | Laterality: Right

## 2014-01-02 MED ORDER — BUPIVACAINE-EPINEPHRINE 0.25% -1:200000 IJ SOLN
INTRAMUSCULAR | Status: DC | PRN
  Administered 2014-01-02: 30 mL

## 2014-01-02 MED ORDER — PROPOFOL 10 MG/ML IV BOLUS
INTRAVENOUS | Status: AC
  Filled 2014-01-02: qty 20

## 2014-01-02 MED ORDER — 0.9 % SODIUM CHLORIDE (POUR BTL) OPTIME
TOPICAL | Status: DC | PRN
  Administered 2014-01-02: 1000 mL

## 2014-01-02 MED ORDER — FENTANYL CITRATE 0.05 MG/ML IJ SOLN
INTRAMUSCULAR | Status: AC
  Filled 2014-01-02: qty 2

## 2014-01-02 MED ORDER — LACTATED RINGERS IV SOLN
INTRAVENOUS | Status: DC | PRN
  Administered 2014-01-02: 15:00:00 via INTRAVENOUS

## 2014-01-02 MED ORDER — LIDOCAINE HCL (CARDIAC) 20 MG/ML IV SOLN
INTRAVENOUS | Status: AC
  Filled 2014-01-02: qty 5

## 2014-01-02 MED ORDER — ONDANSETRON HCL 4 MG/2ML IJ SOLN
INTRAMUSCULAR | Status: DC | PRN
  Administered 2014-01-02: 4 mg via INTRAVENOUS

## 2014-01-02 MED ORDER — OXYCODONE-ACETAMINOPHEN 5-325 MG PO TABS: 1.0000 | ORAL_TABLET | ORAL | Status: DC | PRN

## 2014-01-02 MED ORDER — ONDANSETRON HCL 4 MG/2ML IJ SOLN
INTRAMUSCULAR | Status: AC
  Filled 2014-01-02: qty 2

## 2014-01-02 MED ORDER — MIDAZOLAM HCL 2 MG/2ML IJ SOLN
INTRAMUSCULAR | Status: AC
  Filled 2014-01-02: qty 2

## 2014-01-02 MED ORDER — FENTANYL CITRATE 0.05 MG/ML IJ SOLN
INTRAMUSCULAR | Status: DC | PRN
  Administered 2014-01-02 (×3): 50 ug via INTRAVENOUS
  Administered 2014-01-02: 100 ug via INTRAVENOUS

## 2014-01-02 MED ORDER — METOCLOPRAMIDE HCL 5 MG/ML IJ SOLN
INTRAMUSCULAR | Status: AC
  Filled 2014-01-02: qty 2

## 2014-01-02 MED ORDER — LACTATED RINGERS IV SOLN
INTRAVENOUS | Status: DC
  Administered 2014-01-02: 15:00:00 via INTRAVENOUS

## 2014-01-02 MED ORDER — PROPOFOL 10 MG/ML IV BOLUS
INTRAVENOUS | Status: DC | PRN
  Administered 2014-01-02: 200 mg via INTRAVENOUS

## 2014-01-02 MED ORDER — MIDAZOLAM HCL 5 MG/5ML IJ SOLN
INTRAMUSCULAR | Status: DC | PRN
  Administered 2014-01-02: 2 mg via INTRAVENOUS

## 2014-01-02 MED ORDER — BUPIVACAINE-EPINEPHRINE (PF) 0.25% -1:200000 IJ SOLN
INTRAMUSCULAR | Status: AC
  Filled 2014-01-02: qty 30

## 2014-01-02 MED ORDER — METOCLOPRAMIDE HCL 5 MG/ML IJ SOLN
10.0000 mg | Freq: Once | INTRAMUSCULAR | Status: AC
  Administered 2014-01-02: 10 mg via INTRAVENOUS

## 2014-01-02 MED ORDER — VANCOMYCIN HCL IN DEXTROSE 1-5 GM/200ML-% IV SOLN
1000.0000 mg | INTRAVENOUS | Status: AC
  Administered 2014-01-02: 1000 mg via INTRAVENOUS
  Filled 2014-01-02: qty 200

## 2014-01-02 MED ORDER — LIDOCAINE HCL (CARDIAC) 20 MG/ML IV SOLN
INTRAVENOUS | Status: DC | PRN
  Administered 2014-01-02: 50 mg via INTRAVENOUS

## 2014-01-02 MED ORDER — FENTANYL CITRATE 0.05 MG/ML IJ SOLN
INTRAMUSCULAR | Status: AC
  Filled 2014-01-02: qty 5

## 2014-01-02 MED ORDER — FENTANYL CITRATE 0.05 MG/ML IJ SOLN
25.0000 ug | INTRAMUSCULAR | Status: DC | PRN
  Administered 2014-01-02: 50 ug via INTRAVENOUS
  Administered 2014-01-02 (×3): 25 ug via INTRAVENOUS

## 2014-01-02 SURGICAL SUPPLY — 46 items
ADH SKN CLS APL DERMABOND .7 (GAUZE/BANDAGES/DRESSINGS) ×1
APPLIER CLIP 9.375 MED OPEN (MISCELLANEOUS)
APR CLP MED 9.3 20 MLT OPN (MISCELLANEOUS)
BINDER BREAST LRG (GAUZE/BANDAGES/DRESSINGS) IMPLANT
BINDER BREAST XLRG (GAUZE/BANDAGES/DRESSINGS) IMPLANT
BLADE SURG 15 STRL LF DISP TIS (BLADE) IMPLANT
BLADE SURG 15 STRL SS (BLADE) ×3
CANISTER SUCTION 2500CC (MISCELLANEOUS) IMPLANT
CHLORAPREP W/TINT 26ML (MISCELLANEOUS) ×3 IMPLANT
CLIP APPLIE 9.375 MED OPEN (MISCELLANEOUS) IMPLANT
CONT SPEC 4OZ CLIKSEAL STRL BL (MISCELLANEOUS) IMPLANT
COVER SURGICAL LIGHT HANDLE (MISCELLANEOUS) ×3 IMPLANT
DERMABOND ADVANCED (GAUZE/BANDAGES/DRESSINGS) ×2
DERMABOND ADVANCED .7 DNX12 (GAUZE/BANDAGES/DRESSINGS) ×1 IMPLANT
DEVICE DUBIN SPECIMEN MAMMOGRA (MISCELLANEOUS) ×3 IMPLANT
DRAPE CHEST BREAST 15X10 FENES (DRAPES) ×3 IMPLANT
DRAPE UTILITY 15X26 W/TAPE STR (DRAPE) ×6 IMPLANT
ELECT COATED BLADE 2.86 ST (ELECTRODE) ×3 IMPLANT
ELECT REM PT RETURN 9FT ADLT (ELECTROSURGICAL) ×3
ELECTRODE REM PT RTRN 9FT ADLT (ELECTROSURGICAL) ×1 IMPLANT
GLOVE BIO SURGEON STRL SZ7.5 (GLOVE) ×5 IMPLANT
GLOVE BIOGEL PI IND STRL 7.0 (GLOVE) IMPLANT
GLOVE BIOGEL PI INDICATOR 7.0 (GLOVE) ×2
GLOVE SURG SS PI 7.0 STRL IVOR (GLOVE) ×2 IMPLANT
GOWN STRL REUS W/ TWL LRG LVL3 (GOWN DISPOSABLE) ×2 IMPLANT
GOWN STRL REUS W/TWL LRG LVL3 (GOWN DISPOSABLE) ×6
KIT BASIN OR (CUSTOM PROCEDURE TRAY) ×3 IMPLANT
KIT MARKER MARGIN INK (KITS) IMPLANT
KIT ROOM TURNOVER OR (KITS) ×3 IMPLANT
NDL HYPO 25GX1X1/2 BEV (NEEDLE) ×1 IMPLANT
NEEDLE HYPO 25GX1X1/2 BEV (NEEDLE) ×3 IMPLANT
NS IRRIG 1000ML POUR BTL (IV SOLUTION) ×3 IMPLANT
PACK SURGICAL SETUP 50X90 (CUSTOM PROCEDURE TRAY) ×3 IMPLANT
PAD ARMBOARD 7.5X6 YLW CONV (MISCELLANEOUS) ×3 IMPLANT
PENCIL BUTTON HOLSTER BLD 10FT (ELECTRODE) ×3 IMPLANT
SPONGE LAP 18X18 X RAY DECT (DISPOSABLE) ×3 IMPLANT
SUT MON AB 4-0 PC3 18 (SUTURE) ×3 IMPLANT
SUT SILK 2 0 SH (SUTURE) IMPLANT
SUT VIC AB 3-0 SH 18 (SUTURE) ×3 IMPLANT
SYR BULB 3OZ (MISCELLANEOUS) ×3 IMPLANT
SYR CONTROL 10ML LL (SYRINGE) ×6 IMPLANT
TOWEL OR 17X24 6PK STRL BLUE (TOWEL DISPOSABLE) ×3 IMPLANT
TOWEL OR 17X26 10 PK STRL BLUE (TOWEL DISPOSABLE) ×3 IMPLANT
TUBE CONNECTING 12'X1/4 (SUCTIONS)
TUBE CONNECTING 12X1/4 (SUCTIONS) IMPLANT
YANKAUER SUCT BULB TIP NO VENT (SUCTIONS) IMPLANT

## 2014-01-02 NOTE — Interval H&P Note (Signed)
History and Physical Interval Note:  01/02/2014 2:04 PM  Madeline Simpson  has presented today for surgery, with the diagnosis of right breast calcification  The various methods of treatment have been discussed with the patient and family. After consideration of risks, benefits and other options for treatment, the patient has consented to  Procedure(s): BREAST LUMPECTOMY WITH NEEDLE LOCALIZATION (Right) as a surgical intervention .  The patient's history has been reviewed, patient examined, no change in status, stable for surgery.  I have reviewed the patient's chart and labs.  Questions were answered to the patient's satisfaction.     [name]

## 2014-01-02 NOTE — H&P (View-Only) (Signed)
Patient ID: Madeline Simpson, female   DOB: [birth year], 53 y.o.   MRN: [id number]  Chief Complaint  Patient presents with  . eval right breast    HPI Madeline Simpson is a 53 y.o. female.  We're asked to see the patient in consultation by Dr. Dorthy Cooler to evaluate Her for right breast calcifications. The patient is a 53 year old white female who recently went for a routine screening mammogram. At that time she was initially having some white discharge from her left breast. She also felt a knot in her left breast. She underwent mammogram and ultrasound which did not show a mass in the left breast but did find some worrisome appearing calcifications centrally in the right breast. 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 She denied any right breast pain.  HPI  Past Medical History  Diagnosis Date  . Diabetes mellitus   . Hypertension   . Hyperlipidemia   . H/O tobacco use, presenting hazards to health   . Complication of anesthesia     "came out fighting, put back under aneth"    Past Surgical History  Procedure Laterality Date  . Cholecystectomy    . Abdominal hysterectomy      partial  . Knee surgery      right  . Hand surgery      Family History  Problem Relation Age of Onset  . Cerebral aneurysm Mother 58  . Hypertension Mother     Social History History  Substance Use Topics  . Smoking status: Former Smoker -- 1.00 packs/day for 25 years    Types: Cigarettes    Quit date: 04/09/2011  . Smokeless tobacco: Never Used  . Alcohol Use: No    Allergies  Allergen Reactions  . Penicillins Anaphylaxis  . Apple Itching and Swelling  . Codeine Hives, Itching and Other (See Comments)    delirioius  . Naproxen Hives and Itching  . Shellfish Allergy Itching  . Ciprofloxacin Itching and Rash    Current Outpatient Prescriptions  Medication Sig Dispense Refill  . aspirin 325 MG tablet Take 325 mg by mouth daily.      Marland Kitchen  atorvastatin (LIPITOR) 40 MG tablet Take 40 mg by mouth daily.      Marland Kitchen FLUoxetine (PROZAC) 20 MG capsule Take 20 mg by mouth daily.      . fluticasone (FLONASE) 50 MCG/ACT nasal spray Place 1 spray into both nostrils daily.      . hydrochlorothiazide (HYDRODIURIL) 25 MG tablet Take 25 mg by mouth daily.      Marland Kitchen HYDROcodone-acetaminophen (NORCO/VICODIN) 5-325 MG per tablet Take 1 tablet by mouth every 4 (four) hours as needed.  6 tablet  0  . ibuprofen (ADVIL,MOTRIN) 200 MG tablet Take 400 mg by mouth every 6 (six) hours as needed for mild pain.       Marland Kitchen insulin NPH Human (HUMULIN N,NOVOLIN N) 100 UNIT/ML injection Inject 35-55 Units into the skin 2 (two) times daily before a meal. 55 in the morning and 35 at night      . lisinopril (PRINIVIL,ZESTRIL) 20 MG tablet Take 1 tablet (20 mg total) by mouth daily.  30 tablet  0  . loratadine (CLARITIN) 10 MG tablet Take 10 mg by mouth daily.      . metFORMIN (GLUCOPHAGE) 500 MG tablet Take 2 tablets (1,000 mg total) by mouth 2 (two) times daily with a meal.  120 tablet  0  . Multiple Vitamin (  MULTIVITAMIN WITH MINERALS) TABS tablet Take 1 tablet by mouth daily.      . polyethylene glycol powder (GLYCOLAX/MIRALAX) powder Take 17 g by mouth daily. Until daily soft stools  OTC  255 g  0  . PROCTOSOL HC 2.5 % rectal cream        No current facility-administered medications for this visit.    Review of Systems Review of Systems  Constitutional: Negative.   HENT: Negative.   Eyes: Negative.   Respiratory: Negative.   Cardiovascular: Negative.   Gastrointestinal: Negative.   Endocrine: Negative.   Genitourinary: Negative.   Musculoskeletal: Negative.   Skin: Negative.   Allergic/Immunologic: Negative.   Neurological: Negative.   Hematological: Negative.   Psychiatric/Behavioral: Negative.     Blood pressure 160/90, pulse 80, temperature 97.2 F (36.2 C), height 5\' 7"  (1.702 m), weight 216 lb 3.2 oz (98.068 kg).  Physical Exam Physical Exam   Constitutional: She is oriented to person, place, and time. She appears well-developed and well-nourished.  HENT:  Head: Normocephalic and atraumatic.  Eyes: Conjunctivae and EOM are normal. Pupils are equal, round, and reactive to light.  Neck: Normal range of motion. Neck supple.  Cardiovascular: Normal rate, regular rhythm and normal heart sounds.   Pulmonary/Chest: Effort normal and breath sounds normal.  There is no palpable mass in either breast. There is no palpable axillary, supraclavicular, or cervical lymphadenopathy  Abdominal: Soft. Bowel sounds are normal.  Musculoskeletal: Normal range of motion.  Lymphadenopathy:    She has no cervical adenopathy.  Neurological: She is alert and oriented to person, place, and time.  Skin: Skin is warm and dry.  Psychiatric: She has a normal mood and affect. Her behavior is normal.    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 Because this lesion puts her in a high risk category our recommendation is to have this area removed. I've discussed with her in detail the risks and benefits of the operation to remove this area as well as some of the technical aspects and she understands and wishes to proceed     Plan    Plan for right breast wire localized lumpectomy        [name] 12/09/2013, 12:46 PM

## 2014-01-02 NOTE — Anesthesia Postprocedure Evaluation (Signed)
  Anesthesia Post-op Note  Patient: Madeline Simpson  Procedure(s) Performed: Procedure(s): BREAST LUMPECTOMY WITH NEEDLE LOCALIZATION (Right)  Patient Location: PACU  Anesthesia Type:General  Level of Consciousness: awake  Airway and Oxygen Therapy: Patient Spontanous Breathing  Post-op Pain: mild  Post-op Assessment: Post-op Vital signs reviewed  Post-op Vital Signs: Reviewed  Last Vitals:  Filed Vitals:   01/02/14 1630  BP: 151/76  Pulse: 108  Temp:   Resp: 21    Complications: No apparent anesthesia complications

## 2014-01-02 NOTE — Op Note (Signed)
01/02/2014  3:56 PM  PATIENT:  Madeline Simpson  53 y.o. female  PRE-OPERATIVE DIAGNOSIS:  right breast calcification  POST-OPERATIVE DIAGNOSIS:  right breast calcification  PROCEDURE:  Procedure(s): BREAST LUMPECTOMY WITH NEEDLE LOCALIZATION (Right)  SURGEON:  Surgeon(s) and Role:    * Merrie Roof, MD - Primary  PHYSICIAN ASSISTANT:   ASSISTANTS: none   ANESTHESIA:   general  EBL:     BLOOD ADMINISTERED:none  DRAINS: none   LOCAL MEDICATIONS USED:  MARCAINE     SPECIMEN:  Source of Specimen:  right breast tissue  DISPOSITION OF SPECIMEN:  PATHOLOGY  COUNTS:  YES  TOURNIQUET:  * No tourniquets in log *  DICTATION: .Dragon Dictation After informed consent was obtained the patient was brought to the operating room and placed in the supine position on the operating room table. After adequate induction of general anesthesia the patient's right breast was prepped with ChloraPrep, allowed to dry, and draped in usual sterile manner. Earlier in the day the patient underwent wire localization procedure and the wire was entering the right breast medially and headed laterally area.  a periareolar incision Was made at the medial edge with a 15 blade knife. This incision was carried through the skin and subcutaneous tissue sharply with electrocautery. The dissection was carried medially and deep. The path of the wire could be palpated. A circular portion of breast tissue was excised sharply around the path of the wire. Once the specimen was removed a specimen radiograph was obtained that showed the clip to be near the center of the specimen. The specimen was then sent to pathology for further evaluation. Hemostasis was achieved using the Bovie electrocautery. The wound was irrigated with copious amounts of saline and infiltrated with quarter percent Marcaine. The deep layer the wound was closed with interrupted 3-0 Vicryl stitches. The skin was then closed with interrupted 4-0 Monocryl  subcuticular stitches. Dermabond dressings were applied. The patient tolerated the procedure well. At the end of the case needle sponge and instrument counts were correct. The patient was then awakened and taken to recovery in stable condition.  PLAN OF CARE: Discharge to home after PACU  PATIENT DISPOSITION:  PACU - hemodynamically stable.   Delay start of Pharmacological VTE agent (>24hrs) due to surgical blood loss or risk of bleeding: not applicable

## 2014-01-02 NOTE — Transfer of Care (Signed)
Immediate Anesthesia Transfer of Care Note  Patient: Madeline Simpson  Procedure(s) Performed: Procedure(s): BREAST LUMPECTOMY WITH NEEDLE LOCALIZATION (Right)  Patient Location: PACU  Anesthesia Type:General  Level of Consciousness: awake and alert   Airway & Oxygen Therapy: Patient Spontanous Breathing and Patient connected to nasal cannula oxygen  Post-op Assessment: Report given to PACU RN and Post -op Vital signs reviewed and stable  Post vital signs: Reviewed and stable  Complications: No apparent anesthesia complications

## 2014-01-03 ENCOUNTER — Encounter (HOSPITAL_COMMUNITY): Payer: Self-pay | Admitting: General Surgery

## 2014-01-07 ENCOUNTER — Encounter (INDEPENDENT_AMBULATORY_CARE_PROVIDER_SITE_OTHER): Payer: Self-pay

## 2014-01-13 ENCOUNTER — Ambulatory Visit (INDEPENDENT_AMBULATORY_CARE_PROVIDER_SITE_OTHER): Payer: BC Managed Care – PPO | Admitting: General Surgery

## 2014-01-13 ENCOUNTER — Encounter (INDEPENDENT_AMBULATORY_CARE_PROVIDER_SITE_OTHER): Payer: Self-pay | Admitting: General Surgery

## 2014-01-13 VITALS — BP 144/82 | HR 80 | Temp 97.4°F | Ht 67.0 in | Wt 218.0 lb

## 2014-01-13 DIAGNOSIS — N6089 Other benign mammary dysplasias of unspecified breast: Secondary | ICD-10-CM

## 2014-01-13 DIAGNOSIS — N6091 Unspecified benign mammary dysplasia of right breast: Secondary | ICD-10-CM | POA: Insufficient documentation

## 2014-01-13 NOTE — Progress Notes (Signed)
Subjective:     Patient ID: Madeline Simpson, female   DOB: 12/15/1960, 53 y.o.   MRN: [id number]  HPI The patient is a 53-year-old black female who is about 2 weeks status post right lumpectomy for atypical duct hyperplasia. She tolerated the surgery well. She still has some occasional sharp pains but this is improving.  Review of Systems     Objective:   Physical Exam On exam her right breast incision is healing nicely with no sign of infection or significant seroma    Assessment:     The patient is 2 weeks status post right lumpectomy for atypical duct hyperplasia     Plan:     At this point I would like to see her back in the next month or 2 to check her progress. I did talk to her at length today about being in a high risk group for breast cancer. I discussed with her today the options of observation versus potentially taking an anti-estrogen medicine to help reduce the risk. At this point she is not interested in taking another medicine. We will continue to follow her closely.       

## 2014-01-20 ENCOUNTER — Encounter (HOSPITAL_COMMUNITY): Payer: Self-pay | Admitting: *Deleted

## 2014-01-27 ENCOUNTER — Other Ambulatory Visit: Payer: Self-pay | Admitting: Gastroenterology

## 2014-01-28 NOTE — Addendum Note (Signed)
Addended by: Arta Silence on: 01/28/2014 12:25 PM   Modules accepted: Orders

## 2014-01-29 ENCOUNTER — Encounter (HOSPITAL_COMMUNITY)
Admission: RE | Disposition: A | Discharge status: Home / Self Care | Payer: Self-pay | Source: Ambulatory Visit | Attending: Gastroenterology

## 2014-01-29 ENCOUNTER — Encounter (HOSPITAL_COMMUNITY): Payer: Self-pay

## 2014-01-29 ENCOUNTER — Ambulatory Visit (HOSPITAL_COMMUNITY)
Admission: RE | Admit: 2014-01-29 | Discharge: 2014-01-29 | Disposition: A | Discharge status: Home / Self Care | Payer: BC Managed Care – PPO | Source: Ambulatory Visit | Attending: Gastroenterology | Admitting: Gastroenterology

## 2014-01-29 ENCOUNTER — Encounter (HOSPITAL_COMMUNITY): Payer: BC Managed Care – PPO | Admitting: Anesthesiology

## 2014-01-29 ENCOUNTER — Ambulatory Visit (HOSPITAL_COMMUNITY): Payer: BC Managed Care – PPO | Admitting: Anesthesiology

## 2014-01-29 DIAGNOSIS — I10 Essential (primary) hypertension: Secondary | ICD-10-CM | POA: Insufficient documentation

## 2014-01-29 DIAGNOSIS — D126 Benign neoplasm of colon, unspecified: Secondary | ICD-10-CM | POA: Insufficient documentation

## 2014-01-29 DIAGNOSIS — Z87891 Personal history of nicotine dependence: Secondary | ICD-10-CM | POA: Insufficient documentation

## 2014-01-29 DIAGNOSIS — E119 Type 2 diabetes mellitus without complications: Secondary | ICD-10-CM | POA: Insufficient documentation

## 2014-01-29 DIAGNOSIS — K644 Residual hemorrhoidal skin tags: Secondary | ICD-10-CM | POA: Insufficient documentation

## 2014-01-29 DIAGNOSIS — K921 Melena: Secondary | ICD-10-CM | POA: Insufficient documentation

## 2014-01-29 DIAGNOSIS — R109 Unspecified abdominal pain: Secondary | ICD-10-CM | POA: Insufficient documentation

## 2014-01-29 DIAGNOSIS — K5730 Diverticulosis of large intestine without perforation or abscess without bleeding: Secondary | ICD-10-CM | POA: Insufficient documentation

## 2014-01-29 HISTORY — PX: COLONOSCOPY WITH PROPOFOL: SHX5780

## 2014-01-29 LAB — GLUCOSE, CAPILLARY: Glucose-Capillary: 114 mg/dL — ABNORMAL HIGH (ref 70–99)

## 2014-01-29 SURGERY — COLONOSCOPY WITH PROPOFOL
Anesthesia: Monitor Anesthesia Care

## 2014-01-29 MED ORDER — PROPOFOL 10 MG/ML IV BOLUS
INTRAVENOUS | Status: AC
  Filled 2014-01-29: qty 20

## 2014-01-29 MED ORDER — PROPOFOL 10 MG/ML IV BOLUS
INTRAVENOUS | Status: AC
Start: 2014-01-29 — End: 2014-01-29
  Filled 2014-01-29: qty 20

## 2014-01-29 MED ORDER — SODIUM CHLORIDE 0.9 % IV SOLN: INTRAVENOUS | Status: DC

## 2014-01-29 MED ORDER — LACTATED RINGERS IV SOLN
INTRAVENOUS | Status: DC
Start: 2014-01-29 — End: 2014-01-29
  Administered 2014-01-29: 10:00:00 via INTRAVENOUS

## 2014-01-29 MED ORDER — PROPOFOL 10 MG/ML IV BOLUS
INTRAVENOUS | Status: DC | PRN
  Administered 2014-01-29: 50 mg via INTRAVENOUS
  Administered 2014-01-29: 100 mg via INTRAVENOUS
  Administered 2014-01-29 (×3): 50 mg via INTRAVENOUS

## 2014-01-29 SURGICAL SUPPLY — 22 items

## 2014-01-29 NOTE — Interval H&P Note (Signed)
History and Physical Interval Note:  01/29/2014 9:41 AM  Madeline Simpson  has presented today for surgery, with the diagnosis of abd.pain  The various methods of treatment have been discussed with the patient and family. After consideration of risks, benefits and other options for treatment, the patient has consented to  Procedure(s): COLONOSCOPY WITH PROPOFOL (N/A) as a surgical intervention .  The patient's history has been reviewed, patient examined, no change in status, stable for surgery.  I have reviewed the patient's chart and labs.  Questions were answered to the patient's satisfaction.    Assessment:  1.  Abdominal pain. 2.  Blood in stool.  Plan:  1.  Colonoscopy. 2.  Risks (bleeding, infection, bowel perforation that could require surgery, sedation-related changes in cardiopulmonary systems), benefits (identification and possible treatment of source of symptoms, exclusion of certain causes of symptoms), and alternatives (watchful waiting, radiographic imaging studies, empiric medical treatment) of colonoscopy were explained to patient/family in detail and patient wishes to proceed.  Landry Dyke

## 2014-01-29 NOTE — Transfer of Care (Signed)
Immediate Anesthesia Transfer of Care Note  Patient: Madeline Simpson  Procedure(s) Performed: Procedure(s) (LRB): COLONOSCOPY WITH PROPOFOL (N/A)  Patient Location: PACU  Anesthesia Type: MAC  Level of Consciousness: sedated, patient cooperative and responds to stimulation  Airway & Oxygen Therapy: Patient Spontanous Breathing and Patient connected to face mask oxgen  Post-op Assessment: Report given to PACU RN and Post -op Vital signs reviewed and stable  Post vital signs: Reviewed and stable  Complications: No apparent anesthesia complications

## 2014-01-29 NOTE — Anesthesia Preprocedure Evaluation (Signed)
Anesthesia Evaluation  Patient identified by MRN, date of birth, ID band Patient awake    Reviewed: Allergy & Precautions, H&P , NPO status , Patient's Chart, lab work & pertinent test results  Airway Mallampati: II TM Distance: >3 FB Neck ROM: Full    Dental no notable dental hx.    Pulmonary neg pulmonary ROS, former smoker,  breath sounds clear to auscultation  Pulmonary exam normal       Cardiovascular hypertension, Rhythm:Regular Rate:Normal     Neuro/Psych negative neurological ROS  negative psych ROS   GI/Hepatic Neg liver ROS,   Endo/Other  diabetes  Renal/GU negative Renal ROS  negative genitourinary   Musculoskeletal negative musculoskeletal ROS (+)   Abdominal   Peds negative pediatric ROS (+)  Hematology negative hematology ROS (+)   Anesthesia Other Findings   Reproductive/Obstetrics negative OB ROS                           Anesthesia Physical Anesthesia Plan  ASA: II  Anesthesia Plan: MAC   Post-op Pain Management:    Induction: Intravenous  Airway Management Planned: Simple Face Mask  Additional Equipment:   Intra-op Plan:   Post-operative Plan: Extubation in OR  Informed Consent: I have reviewed the patients History and Physical, chart, labs and discussed the procedure including the risks, benefits and alternatives for the proposed anesthesia with the patient or authorized representative who has indicated his/her understanding and acceptance.   Dental advisory given  Plan Discussed with: CRNA and Surgeon  Anesthesia Plan Comments:         Anesthesia Quick Evaluation

## 2014-01-29 NOTE — Op Note (Signed)
[facility] [place], [address]   COLONOSCOPY PROCEDURE REPORT  PATIENT: Madeline Simpson, Madeline Simpson  MR#: [id number] BIRTHDATE: 07-11-1961 , 52  yrs. old GENDER: Female ENDOSCOPIST: Arta Silence, MD REFERRED PH:XTAVW Dorthy Cooler, M.D. PROCEDURE DATE:  01/29/2014 PROCEDURE:   Colonoscopy with cold biopsy polypectomy ASA CLASS:   Class II INDICATIONS:abdominal pain, blood in stool. MEDICATIONS: MAC sedation, administered by CRNA  DESCRIPTION OF PROCEDURE:   After the risks benefits and alternatives of the procedure were thoroughly explained, informed consent was obtained.  A digital rectal exam revealed external hemorrhoids.   The Pentax Ped Colon [id number]  endoscope was introduced through the anus and advanced to the cecum, which was identified by both the appendix and ileocecal valve. No adverse events experienced.   The quality of the prep was adequate.  The instrument was then slowly withdrawn as the colon was fully examined.     Findings:  External hemorrhoids, otherwise normal digital rectal exam.  Multiple medium-sized diverticula scattered throughout the colon.  Couple small polyps, transverse and sigmoid, removed with cold biopsy forceps.  No other polyps, masses, vascular ectasias, or inflammatory changes were identified.  Normal retroflexed view of rectum.       Withdrawal time was   .  The scope was withdrawn and the procedure completed.  ENDOSCOPIC IMPRESSION:     As above.  Suspect blood in stool is from external hemorrhoids.  No explanation for patient's abdominal pain was identified.  RECOMMENDATIONS:     1.  Watch for potential complications of procedure. 2.  High fiber diet. 3.  Await polypectomy results. 4.  Consider trial of Donnatal (dicyclomine/Bentyl has not been helping) for abdominal pain. 5.  Topical therapies (e.g., Preparation-H) as needed for hemorrhoidal bleeding. 6.  Repeat colonoscopy in 5-10 years pending polypectomy  findings. 7.  Follow-up with [facility] on as-needed basis, pending clinical response to Donnatal.  eSigned:  Arta Silence, MD 01/29/2014 10:26 AM   cc:

## 2014-01-29 NOTE — Anesthesia Postprocedure Evaluation (Signed)
  Anesthesia Post-op Note  Patient: Madeline Simpson  Procedure(s) Performed: Procedure(s) (LRB): COLONOSCOPY WITH PROPOFOL (N/A)  Patient Location: PACU  Anesthesia Type: General  Level of Consciousness: awake and alert   Airway and Oxygen Therapy: Patient Spontanous Breathing  Post-op Pain: mild  Post-op Assessment: Post-op Vital signs reviewed, Patient's Cardiovascular Status Stable, Respiratory Function Stable, Patent Airway and No signs of Nausea or vomiting  Last Vitals:  Filed Vitals:   01/29/14 1030  BP: 126/89  Pulse: 86  Temp:   Resp: 18    Post-op Vital Signs: stable   Complications: No apparent anesthesia complications

## 2014-01-29 NOTE — H&P (View-Only) (Signed)
Subjective:     Patient ID: Madeline Simpson, female   DOB: 02-01-1961, 53 y.o.   MRN: [id number]  HPI The patient is a 53 year old black female who is about 2 weeks status post right lumpectomy for atypical duct hyperplasia. She tolerated the surgery well. She still has some occasional sharp pains but this is improving.  Review of Systems     Objective:   Physical Exam On exam her right breast incision is healing nicely with no sign of infection or significant seroma    Assessment:     The patient is 2 weeks status post right lumpectomy for atypical duct hyperplasia     Plan:     At this point I would like to see her back in the next month or 2 to check her progress. I did talk to her at length today about being in a high risk group for breast cancer. I discussed with her today the options of observation versus potentially taking an anti-estrogen medicine to help reduce the risk. At this point she is not interested in taking another medicine. We will continue to follow her closely.

## 2014-01-29 NOTE — Discharge Instructions (Signed)
Colonoscopy ° °Post procedure instructions: ° °Read the instructions outlined below and refer to this sheet in the next few weeks. These discharge instructions provide you with general information on caring for yourself after you leave the hospital. Your doctor may also give you specific instructions. While your treatment has been planned according to the most current medical practices available, unavoidable complications occasionally occur. If you have any problems or questions after discharge, call Dr. Jaymin Waln at [facility] ([phone]). ° °HOME CARE INSTRUCTIONS ° °ACTIVITY: °· You may resume your regular activity, but move at a slower pace for the next 24 hours.  °· Take frequent rest periods for the next 24 hours.  °· Walking will help get rid of the air and reduce the bloated feeling in your belly (abdomen).  °· No driving for 24 hours (because of the medicine (anesthesia) used during the test).  °· You may shower.  °· Do not sign any important legal documents or operate any machinery for 24 hours (because of the anesthesia used during the test).  °NUTRITION: °· Drink plenty of fluids.  °· You may resume your normal diet as instructed by your doctor.  °· Begin with a light meal and progress to your normal diet. Heavy or fried foods are harder to digest and may make you feel sick to your stomach (nauseated).  °· Avoid alcoholic beverages for 24 hours or as instructed.  °MEDICATIONS: °· You may resume your normal medications unless your doctor tells you otherwise.  °WHAT TO EXPECT TODAY: °· Some feelings of bloating in the abdomen.  °· Passage of more gas than usual.  °· Spotting of blood in your stool or on the toilet paper.  °IF YOU HAD POLYPS REMOVED DURING THE COLONOSCOPY: °· No aspirin products for 7 days or as instructed.  °· No alcohol for 7 days or as instructed.  °· Eat a soft diet for the next 24 hours.  ° °FINDING OUT THE RESULTS OF YOUR TEST ° °Not all test results are available during your  visit. If your test results are not back during the visit, make an appointment with your caregiver to find out the results. Do not assume everything is normal if you have not heard from your caregiver or the medical facility. It is important for you to follow up on all of your test results.  ° ° ° °SEEK IMMEDIATE MEDICAL CARE IF: ° °· You have more than a spotting of blood in your stool.  °· Your belly is swollen (abdominal distention).  °· You are nauseated or vomiting.  °· You have a fever.  °· You have abdominal pain or discomfort that is severe or gets worse throughout the day.  ° ° °Document Released: 03/15/2004 Document Revised: 04/13/2011 Document Reviewed: 03/13/2008 °ExitCare® Patient Information ©2012 [employer]. ° °

## 2014-01-30 ENCOUNTER — Encounter (HOSPITAL_COMMUNITY): Payer: Self-pay | Admitting: Gastroenterology

## 2014-02-03 NOTE — Progress Notes (Addendum)
During post-call, pt reported she has been having diarrhea since her colonoscopy on 6/17; pt states she was not having diarrhea before her procedure. No other symptoms reported. Advised pt to report diarrhea to Dr. Erlinda Hong office and make sure she is drinking lots of liquids to stay hydrated. Pt verbalized understanding.

## 2014-03-11 ENCOUNTER — Encounter (INDEPENDENT_AMBULATORY_CARE_PROVIDER_SITE_OTHER): Payer: BC Managed Care – PPO | Admitting: General Surgery

## 2014-03-24 ENCOUNTER — Encounter (INDEPENDENT_AMBULATORY_CARE_PROVIDER_SITE_OTHER): Payer: BC Managed Care – PPO | Admitting: General Surgery

## 2014-03-25 ENCOUNTER — Encounter (INDEPENDENT_AMBULATORY_CARE_PROVIDER_SITE_OTHER): Payer: BC Managed Care – PPO | Admitting: General Surgery

## 2014-04-07 ENCOUNTER — Encounter (INDEPENDENT_AMBULATORY_CARE_PROVIDER_SITE_OTHER): Payer: Self-pay | Admitting: General Surgery

## 2014-07-24 ENCOUNTER — Emergency Department (HOSPITAL_COMMUNITY): Payer: BC Managed Care – PPO

## 2014-07-24 ENCOUNTER — Encounter (HOSPITAL_COMMUNITY): Payer: Self-pay | Admitting: Emergency Medicine

## 2014-07-24 ENCOUNTER — Emergency Department (HOSPITAL_COMMUNITY)
Admission: EM | Admit: 2014-07-24 | Discharge: 2014-07-24 | Disposition: A | Discharge status: Home / Self Care | Payer: BC Managed Care – PPO | Attending: Emergency Medicine | Admitting: Emergency Medicine

## 2014-07-24 DIAGNOSIS — Z7952 Long term (current) use of systemic steroids: Secondary | ICD-10-CM | POA: Insufficient documentation

## 2014-07-24 DIAGNOSIS — I10 Essential (primary) hypertension: Secondary | ICD-10-CM | POA: Insufficient documentation

## 2014-07-24 DIAGNOSIS — Z79899 Other long term (current) drug therapy: Secondary | ICD-10-CM | POA: Insufficient documentation

## 2014-07-24 DIAGNOSIS — Z90710 Acquired absence of both cervix and uterus: Secondary | ICD-10-CM | POA: Insufficient documentation

## 2014-07-24 DIAGNOSIS — Z794 Long term (current) use of insulin: Secondary | ICD-10-CM | POA: Diagnosis not present

## 2014-07-24 DIAGNOSIS — R109 Unspecified abdominal pain: Secondary | ICD-10-CM

## 2014-07-24 DIAGNOSIS — Z880 Allergy status to penicillin: Secondary | ICD-10-CM | POA: Insufficient documentation

## 2014-07-24 DIAGNOSIS — N390 Urinary tract infection, site not specified: Secondary | ICD-10-CM | POA: Diagnosis not present

## 2014-07-24 DIAGNOSIS — R10A1 Flank pain, right side: Secondary | ICD-10-CM

## 2014-07-24 DIAGNOSIS — Z87891 Personal history of nicotine dependence: Secondary | ICD-10-CM | POA: Insufficient documentation

## 2014-07-24 DIAGNOSIS — E119 Type 2 diabetes mellitus without complications: Secondary | ICD-10-CM | POA: Insufficient documentation

## 2014-07-24 DIAGNOSIS — Z8719 Personal history of other diseases of the digestive system: Secondary | ICD-10-CM | POA: Insufficient documentation

## 2014-07-24 DIAGNOSIS — Z9089 Acquired absence of other organs: Secondary | ICD-10-CM | POA: Insufficient documentation

## 2014-07-24 LAB — URINALYSIS, ROUTINE W REFLEX MICROSCOPIC
Bilirubin Urine: NEGATIVE
Glucose, UA: >1000 mg/dL — AB
Hgb urine dipstick: NEGATIVE
Ketones, ur: 40 mg/dL — AB
LEUKOCYTES UA: NEGATIVE
Nitrite: NEGATIVE
PH: 6 (ref 5.0–8.0)
Protein, ur: 30 mg/dL — AB
SPECIFIC GRAVITY, URINE: 1.01 (ref 1.005–1.030)
Urobilinogen, UA: 0.2 mg/dL (ref 0.0–1.0)

## 2014-07-24 LAB — CBC WITH DIFFERENTIAL/PLATELET
Basophils Absolute: 0 10*3/uL (ref 0.0–0.1)
Basophils Relative: 0 % (ref 0–1)
EOS ABS: 0.1 10*3/uL (ref 0.0–0.7)
EOS PCT: 1 % (ref 0–5)
HCT: 45.1 % (ref 36.0–46.0)
Hemoglobin: 15.6 g/dL — ABNORMAL HIGH (ref 12.0–15.0)
LYMPHS ABS: 2.5 10*3/uL (ref 0.7–4.0)
Lymphocytes Relative: 26 % (ref 12–46)
MCH: 30.7 pg (ref 26.0–34.0)
MCHC: 34.6 g/dL (ref 30.0–36.0)
MCV: 88.8 fL (ref 78.0–100.0)
Monocytes Absolute: 0.7 10*3/uL (ref 0.1–1.0)
Monocytes Relative: 7 % (ref 3–12)
Neutro Abs: 6.2 10*3/uL (ref 1.7–7.7)
Neutrophils Relative %: 66 % (ref 43–77)
PLATELETS: 383 10*3/uL (ref 150–400)
RBC: 5.08 MIL/uL (ref 3.87–5.11)
RDW: 13.4 % (ref 11.5–15.5)
WBC: 9.5 10*3/uL (ref 4.0–10.5)

## 2014-07-24 LAB — COMPREHENSIVE METABOLIC PANEL
ALT: 12 U/L (ref 0–35)
AST: 10 U/L (ref 0–37)
Albumin: 4.3 g/dL (ref 3.5–5.2)
Alkaline Phosphatase: 80 U/L (ref 39–117)
Anion gap: 20 — ABNORMAL HIGH (ref 5–15)
BUN: 11 mg/dL (ref 6–23)
CALCIUM: 9.8 mg/dL (ref 8.4–10.5)
CO2: 22 meq/L (ref 19–32)
Chloride: 94 mEq/L — ABNORMAL LOW (ref 96–112)
Creatinine, Ser: 0.67 mg/dL (ref 0.50–1.10)
GFR calc Af Amer: >90 mL/min (ref >90)
GFR calc non Af Amer: >90 mL/min (ref >90)
Glucose, Bld: 495 mg/dL — ABNORMAL HIGH (ref 70–99)
Potassium: 4.1 mEq/L (ref 3.7–5.3)
SODIUM: 136 meq/L — AB (ref 137–147)
Total Bilirubin: 0.7 mg/dL (ref 0.3–1.2)
Total Protein: 7.9 g/dL (ref 6.0–8.3)

## 2014-07-24 LAB — URINE MICROSCOPIC-ADD ON

## 2014-07-24 LAB — LIPASE, BLOOD: LIPASE: 47 U/L (ref 11–59)

## 2014-07-24 MED ORDER — MORPHINE SULFATE 4 MG/ML IJ SOLN
4.0000 mg | Freq: Once | INTRAMUSCULAR | Status: AC
  Administered 2014-07-24: 4 mg via INTRAVENOUS
  Filled 2014-07-24: qty 1

## 2014-07-24 MED ORDER — ONDANSETRON HCL 4 MG/2ML IJ SOLN
4.0000 mg | Freq: Once | INTRAMUSCULAR | Status: AC
  Administered 2014-07-24: 4 mg via INTRAVENOUS
  Filled 2014-07-24: qty 2

## 2014-07-24 MED ORDER — IOHEXOL 300 MG/ML  SOLN
100.0000 mL | Freq: Once | INTRAMUSCULAR | Status: AC | PRN
Start: 2014-07-24 — End: 2014-07-24
  Administered 2014-07-24: 100 mL via INTRAVENOUS

## 2014-07-24 MED ORDER — PROMETHAZINE HCL 25 MG PO TABS: 25.0000 mg | ORAL_TABLET | Freq: Four times a day (QID) | ORAL | Status: DC | PRN

## 2014-07-24 MED ORDER — NITROFURANTOIN MONOHYD MACRO 100 MG PO CAPS: 100.0000 mg | ORAL_CAPSULE | Freq: Two times a day (BID) | ORAL | Status: DC

## 2014-07-24 MED ORDER — OXYCODONE-ACETAMINOPHEN 5-325 MG PO TABS: 2.0000 | ORAL_TABLET | ORAL | Status: DC | PRN

## 2014-07-24 MED ORDER — IOHEXOL 300 MG/ML  SOLN
50.0000 mL | Freq: Once | INTRAMUSCULAR | Status: AC | PRN
  Administered 2014-07-24: 50 mL via ORAL

## 2014-07-24 MED ORDER — HYDROMORPHONE HCL 1 MG/ML IJ SOLN
1.0000 mg | Freq: Once | INTRAMUSCULAR | Status: AC
  Administered 2014-07-24: 1 mg via INTRAVENOUS
  Filled 2014-07-24: qty 1

## 2014-07-24 MED ORDER — SODIUM CHLORIDE 0.9 % IV BOLUS (SEPSIS)
1000.0000 mL | Freq: Once | INTRAVENOUS | Status: AC
  Administered 2014-07-24: 1000 mL via INTRAVENOUS
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
## 2014-07-24 NOTE — ED Notes (Signed)
Pt request side rail to be let down, water to drink and pain medication for her back states pain is a 9/10. EDPaware

## 2014-07-24 NOTE — Discharge Instructions (Signed)
CT scan showed no obvious reason for your pain. Prescription for urinary antibiotic, pain medicine and nausea medicine. Follow-up your primary care doctor or return if worse.

## 2014-07-24 NOTE — ED Notes (Signed)
Pt c/o right flank pain radiating to abdomen x 2-3 weeks with n/v this am. Pt also c/o dysuria.

## 2014-07-24 NOTE — ED Notes (Signed)
Patient requesting pain medication. MD made aware. Awaiting orders.

## 2014-10-06 ENCOUNTER — Other Ambulatory Visit: Payer: Self-pay | Admitting: Family Medicine

## 2014-10-06 DIAGNOSIS — N644 Mastodynia: Secondary | ICD-10-CM

## 2014-10-06 DIAGNOSIS — N6452 Nipple discharge: Secondary | ICD-10-CM

## 2014-10-20 ENCOUNTER — Other Ambulatory Visit: Payer: Self-pay | Admitting: Family Medicine

## 2014-10-20 DIAGNOSIS — N644 Mastodynia: Secondary | ICD-10-CM

## 2014-10-20 DIAGNOSIS — N6452 Nipple discharge: Secondary | ICD-10-CM

## 2014-11-10 ENCOUNTER — Other Ambulatory Visit: Payer: Self-pay

## 2014-11-17 ENCOUNTER — Other Ambulatory Visit: Payer: Self-pay

## 2014-12-01 ENCOUNTER — Ambulatory Visit
Admission: RE | Admit: 2014-12-01 | Discharge: 2014-12-01 | Disposition: A | Discharge status: Home / Self Care | Payer: BLUE CROSS/BLUE SHIELD | Source: Ambulatory Visit | Attending: Family Medicine | Admitting: Family Medicine

## 2014-12-01 ENCOUNTER — Encounter (INDEPENDENT_AMBULATORY_CARE_PROVIDER_SITE_OTHER): Payer: Self-pay

## 2014-12-01 DIAGNOSIS — N6452 Nipple discharge: Secondary | ICD-10-CM

## 2014-12-01 DIAGNOSIS — N644 Mastodynia: Secondary | ICD-10-CM

## 2015-04-15 ENCOUNTER — Emergency Department (HOSPITAL_COMMUNITY)
Admission: EM | Admit: 2015-04-15 | Discharge: 2015-04-16 | Disposition: A | Discharge status: Home / Self Care | Payer: BLUE CROSS/BLUE SHIELD | Attending: Emergency Medicine | Admitting: Emergency Medicine

## 2015-04-15 ENCOUNTER — Emergency Department (HOSPITAL_COMMUNITY): Payer: BLUE CROSS/BLUE SHIELD

## 2015-04-15 ENCOUNTER — Encounter (HOSPITAL_COMMUNITY): Payer: Self-pay | Admitting: Emergency Medicine

## 2015-04-15 DIAGNOSIS — Z8719 Personal history of other diseases of the digestive system: Secondary | ICD-10-CM | POA: Diagnosis not present

## 2015-04-15 DIAGNOSIS — E119 Type 2 diabetes mellitus without complications: Secondary | ICD-10-CM | POA: Diagnosis not present

## 2015-04-15 DIAGNOSIS — M545 Low back pain: Secondary | ICD-10-CM | POA: Insufficient documentation

## 2015-04-15 DIAGNOSIS — R1032 Left lower quadrant pain: Secondary | ICD-10-CM | POA: Insufficient documentation

## 2015-04-15 DIAGNOSIS — R112 Nausea with vomiting, unspecified: Secondary | ICD-10-CM | POA: Insufficient documentation

## 2015-04-15 DIAGNOSIS — R1031 Right lower quadrant pain: Secondary | ICD-10-CM | POA: Insufficient documentation

## 2015-04-15 DIAGNOSIS — E785 Hyperlipidemia, unspecified: Secondary | ICD-10-CM | POA: Diagnosis not present

## 2015-04-15 DIAGNOSIS — Z87891 Personal history of nicotine dependence: Secondary | ICD-10-CM | POA: Insufficient documentation

## 2015-04-15 DIAGNOSIS — R300 Dysuria: Secondary | ICD-10-CM | POA: Insufficient documentation

## 2015-04-15 DIAGNOSIS — Z794 Long term (current) use of insulin: Secondary | ICD-10-CM | POA: Diagnosis not present

## 2015-04-15 DIAGNOSIS — Z3202 Encounter for pregnancy test, result negative: Secondary | ICD-10-CM | POA: Insufficient documentation

## 2015-04-15 DIAGNOSIS — I10 Essential (primary) hypertension: Secondary | ICD-10-CM | POA: Insufficient documentation

## 2015-04-15 DIAGNOSIS — Z79899 Other long term (current) drug therapy: Secondary | ICD-10-CM | POA: Insufficient documentation

## 2015-04-15 DIAGNOSIS — Z90710 Acquired absence of both cervix and uterus: Secondary | ICD-10-CM | POA: Insufficient documentation

## 2015-04-15 DIAGNOSIS — R1013 Epigastric pain: Secondary | ICD-10-CM | POA: Insufficient documentation

## 2015-04-15 DIAGNOSIS — R1011 Right upper quadrant pain: Secondary | ICD-10-CM | POA: Diagnosis present

## 2015-04-15 DIAGNOSIS — Z7951 Long term (current) use of inhaled steroids: Secondary | ICD-10-CM | POA: Insufficient documentation

## 2015-04-15 DIAGNOSIS — Z880 Allergy status to penicillin: Secondary | ICD-10-CM | POA: Diagnosis not present

## 2015-04-15 DIAGNOSIS — Z87448 Personal history of other diseases of urinary system: Secondary | ICD-10-CM | POA: Insufficient documentation

## 2015-04-15 HISTORY — DX: Disorder of kidney and ureter, unspecified: N28.9

## 2015-04-15 LAB — URINALYSIS, ROUTINE W REFLEX MICROSCOPIC
Bilirubin Urine: NEGATIVE
Glucose, UA: NEGATIVE mg/dL
KETONES UR: NEGATIVE mg/dL
LEUKOCYTES UA: NEGATIVE
NITRITE: NEGATIVE
PH: 5.5 (ref 5.0–8.0)
Protein, ur: 100 mg/dL — AB
SPECIFIC GRAVITY, URINE: 1.031 — AB (ref 1.005–1.030)
UROBILINOGEN UA: 0.2 mg/dL (ref 0.0–1.0)

## 2015-04-15 LAB — CBG MONITORING, ED
GLUCOSE-CAPILLARY: 88 mg/dL (ref 65–99)
Glucose-Capillary: 102 mg/dL — ABNORMAL HIGH (ref 65–99)

## 2015-04-15 LAB — LIPASE, BLOOD: LIPASE: 80 U/L — AB (ref 22–51)

## 2015-04-15 LAB — HEPATIC FUNCTION PANEL
ALBUMIN: 4.7 g/dL (ref 3.5–5.0)
ALK PHOS: 60 U/L (ref 38–126)
ALT: 18 U/L (ref 14–54)
AST: 19 U/L (ref 15–41)
Bilirubin, Direct: <0.1 mg/dL — ABNORMAL LOW (ref 0.1–0.5)
TOTAL PROTEIN: 7.6 g/dL (ref 6.5–8.1)
Total Bilirubin: 0.6 mg/dL (ref 0.3–1.2)

## 2015-04-15 LAB — BASIC METABOLIC PANEL
Anion gap: 13 (ref 5–15)
BUN: 17 mg/dL (ref 6–20)
CHLORIDE: 106 mmol/L (ref 101–111)
CO2: 20 mmol/L — ABNORMAL LOW (ref 22–32)
CREATININE: 0.86 mg/dL (ref 0.44–1.00)
Calcium: 9.9 mg/dL (ref 8.9–10.3)
GFR calc Af Amer: >60 mL/min (ref >60)
GFR calc non Af Amer: >60 mL/min (ref >60)
GLUCOSE: 108 mg/dL — AB (ref 65–99)
POTASSIUM: 3.5 mmol/L (ref 3.5–5.1)
Sodium: 139 mmol/L (ref 135–145)

## 2015-04-15 LAB — I-STAT BETA HCG BLOOD, ED (MC, WL, AP ONLY): I-stat hCG, quantitative: <5 m[IU]/mL (ref <5)

## 2015-04-15 LAB — CBC
HEMATOCRIT: 39 % (ref 36.0–46.0)
Hemoglobin: 13.2 g/dL (ref 12.0–15.0)
MCH: 30.3 pg (ref 26.0–34.0)
MCHC: 33.8 g/dL (ref 30.0–36.0)
MCV: 89.7 fL (ref 78.0–100.0)
PLATELETS: 419 10*3/uL — AB (ref 150–400)
RBC: 4.35 MIL/uL (ref 3.87–5.11)
RDW: 13.5 % (ref 11.5–15.5)
WBC: 12.9 10*3/uL — ABNORMAL HIGH (ref 4.0–10.5)

## 2015-04-15 LAB — URINE MICROSCOPIC-ADD ON

## 2015-04-15 MED ORDER — IOHEXOL 300 MG/ML  SOLN
25.0000 mL | Freq: Once | INTRAMUSCULAR | Status: AC | PRN
  Administered 2015-04-15: 25 mL via ORAL

## 2015-04-15 MED ORDER — ONDANSETRON 4 MG PO TBDP: ORAL_TABLET | ORAL | Status: DC

## 2015-04-15 MED ORDER — GI COCKTAIL ~~LOC~~
30.0000 mL | Freq: Once | ORAL | Status: AC
  Administered 2015-04-15: 30 mL via ORAL
  Filled 2015-04-15: qty 30

## 2015-04-15 MED ORDER — HYDROMORPHONE HCL 1 MG/ML IJ SOLN
1.0000 mg | Freq: Once | INTRAMUSCULAR | Status: AC
  Administered 2015-04-15: 1 mg via INTRAVENOUS
  Filled 2015-04-15: qty 1

## 2015-04-15 MED ORDER — IOHEXOL 300 MG/ML  SOLN
100.0000 mL | Freq: Once | INTRAMUSCULAR | Status: AC | PRN
  Administered 2015-04-15: 100 mL via INTRAVENOUS

## 2015-04-15 MED ORDER — ONDANSETRON HCL 4 MG/2ML IJ SOLN
4.0000 mg | Freq: Once | INTRAMUSCULAR | Status: AC
  Administered 2015-04-15: 4 mg via INTRAVENOUS
  Filled 2015-04-15: qty 2

## 2015-04-15 MED ORDER — HYDROMORPHONE HCL 1 MG/ML IJ SOLN
1.0000 mg | Freq: Once | INTRAMUSCULAR | Status: DC
  Filled 2015-04-15: qty 1

## 2015-04-15 MED ORDER — OXYCODONE-ACETAMINOPHEN 5-325 MG PO TABS: 1.0000 | ORAL_TABLET | ORAL | Status: DC | PRN

## 2015-04-15 NOTE — ED Notes (Signed)
Pt c/o right flank pain and vomiting x 3days.  Pt states pain in flank has increased since onset.  Pt states she has urinary retention and dysuria. Pt states abdomen feels distended.

## 2015-04-15 NOTE — Discharge Instructions (Signed)
Abdominal Pain °Many things can cause abdominal pain. Usually, abdominal pain is not caused by a disease and will improve without treatment. It can often be observed and treated at home. Your health care provider will do a physical exam and possibly order blood tests and X-rays to help determine the seriousness of your pain. However, in many cases, more time must pass before a clear cause of the pain can be found. Before that point, your health care provider may not know if you need more testing or further treatment. °HOME CARE INSTRUCTIONS  °Monitor your abdominal pain for any changes. The following actions may help to alleviate any discomfort you are experiencing: °· Only take over-the-counter or prescription medicines as directed by your health care provider. °· Do not take laxatives unless directed to do so by your health care provider. °· Try a clear liquid diet (broth, tea, or water) as directed by your health care provider. Slowly move to a bland diet as tolerated. °SEEK MEDICAL CARE IF: °· You have unexplained abdominal pain. °· You have abdominal pain associated with nausea or diarrhea. °· You have pain when you urinate or have a bowel movement. °· You experience abdominal pain that wakes you in the night. °· You have abdominal pain that is worsened or improved by eating food. °· You have abdominal pain that is worsened with eating fatty foods. °· You have a fever. °SEEK IMMEDIATE MEDICAL CARE IF:  °· Your pain does not go away within 2 hours. °· You keep throwing up (vomiting). °· Your pain is felt only in portions of the abdomen, such as the right side or the left lower portion of the abdomen. °· You pass bloody or black tarry stools. °MAKE SURE YOU: °· Understand these instructions.   °· Will watch your condition.   °· Will get help right away if you are not doing well or get worse.   °Document Released: 05/11/2005 Document Revised: 08/06/2013 Document Reviewed: 04/10/2013 °Exit[employer]® Patient Information  ©2015 [employer]. This information is not intended to replace advice given to you by your health care provider. Make sure you discuss any questions you have with your health care provider. ° ° ° °Acute Pancreatitis °Acute pancreatitis is a disease in which the pancreas becomes suddenly inflamed. The pancreas is a large gland located behind your stomach. The pancreas produces enzymes that help digest food. The pancreas also releases the hormones glucagon and insulin that help regulate blood sugar. Damage to the pancreas occurs when the digestive enzymes from the pancreas are activated and begin attacking the pancreas before being released into the intestine. Most acute attacks last a couple of days and can cause serious complications. Some people become dehydrated and develop low blood pressure. In severe cases, bleeding into the pancreas can lead to shock and can be life-threatening. The lungs, heart, and kidneys may fail. °CAUSES  °Pancreatitis can happen to anyone. In some cases, the cause is unknown. Most cases are caused by: °· Alcohol abuse. °· Gallstones. °Other less common causes are: °· Certain medicines. °· Exposure to certain chemicals. °· Infection. °· Damage caused by an accident (trauma). °· Abdominal surgery. °SYMPTOMS  °· Pain in the upper abdomen that may radiate to the back. °· Tenderness and swelling of the abdomen. °· Nausea and vomiting. °DIAGNOSIS  °Your caregiver will perform a physical exam. Blood and stool tests may be done to confirm the diagnosis. Imaging tests may also be done, such as X-rays, CT scans, or an ultrasound of the abdomen. °TREATMENT  °  Treatment usually requires a stay in the hospital. Treatment may include: °· Pain medicine. °· Fluid replacement through an intravenous line (IV). °· Placing a tube in the stomach to remove stomach contents and control vomiting. °· Not eating for 3 or 4 days. This gives your pancreas a rest, because enzymes are not being produced that can  cause further damage. °· Antibiotic medicines if your condition is caused by an infection. °· Surgery of the pancreas or gallbladder. °HOME CARE INSTRUCTIONS  °· Follow the diet advised by your caregiver. This may involve avoiding alcohol and decreasing the amount of fat in your diet. °· Eat smaller, more frequent meals. This reduces the amount of digestive juices the pancreas produces. °· Drink enough fluids to keep your urine clear or pale yellow. °· Only take over-the-counter or prescription medicines as directed by your caregiver. °· Avoid drinking alcohol if it caused your condition. °· Do not smoke. °· Get plenty of rest. °· Check your blood sugar at home as directed by your caregiver. °· Keep all follow-up appointments as directed by your caregiver. °SEEK MEDICAL CARE IF:  °· You do not recover as quickly as expected. °· You develop new or worsening symptoms. °· You have persistent pain, weakness, or nausea. °· You recover and then have another episode of pain. °SEEK IMMEDIATE MEDICAL CARE IF:  °· You are unable to eat or keep fluids down. °· Your pain becomes severe. °· You have a fever or persistent symptoms for more than 2 to 3 days. °· You have a fever and your symptoms suddenly get worse. °· Your skin or the white part of your eyes turn yellow (jaundice). °· You develop vomiting. °· You feel dizzy, or you faint. °· Your blood sugar is high (over 300 mg/dL). °MAKE SURE YOU:  °· Understand these instructions. °· Will watch your condition. °· Will get help right away if you are not doing well or get worse. °Document Released: 08/01/2005 Document Revised: 01/31/2012 Document Reviewed: 11/10/2011 °Exit[employer]® Patient Information ©2015 [employer]. This information is not intended to replace advice given to you by your health care provider. Make sure you discuss any questions you have with your health care provider. ° °
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
## 2015-04-15 NOTE — ED Notes (Signed)
Per pt, states dysuria and back pain since Monday-vomiting

## 2015-04-15 NOTE — ED Notes (Signed)
Bed: WA11 Expected date:  Expected time:  Means of arrival:  Comments: Hold for triage 2 

## 2015-08-25 ENCOUNTER — Encounter: Payer: Self-pay | Admitting: Internal Medicine

## 2015-11-03 ENCOUNTER — Other Ambulatory Visit: Payer: Self-pay | Admitting: Family Medicine

## 2015-11-03 DIAGNOSIS — M5430 Sciatica, unspecified side: Secondary | ICD-10-CM

## 2015-11-09 ENCOUNTER — Inpatient Hospital Stay: Admission: RE | Admit: 2015-11-09 | Payer: BLUE CROSS/BLUE SHIELD | Source: Ambulatory Visit

## 2015-11-17 ENCOUNTER — Other Ambulatory Visit: Payer: Self-pay | Admitting: Family Medicine

## 2015-11-17 DIAGNOSIS — M5430 Sciatica, unspecified side: Secondary | ICD-10-CM

## 2015-11-18 ENCOUNTER — Emergency Department (HOSPITAL_COMMUNITY)
Admission: EM | Admit: 2015-11-18 | Discharge: 2015-11-18 | Disposition: A | Discharge status: Home / Self Care | Payer: Managed Care, Other (non HMO) | Attending: Emergency Medicine | Admitting: Emergency Medicine

## 2015-11-18 ENCOUNTER — Encounter (HOSPITAL_COMMUNITY): Payer: Self-pay | Admitting: Family Medicine

## 2015-11-18 DIAGNOSIS — E119 Type 2 diabetes mellitus without complications: Secondary | ICD-10-CM | POA: Diagnosis not present

## 2015-11-18 DIAGNOSIS — Z8719 Personal history of other diseases of the digestive system: Secondary | ICD-10-CM | POA: Insufficient documentation

## 2015-11-18 DIAGNOSIS — I10 Essential (primary) hypertension: Secondary | ICD-10-CM | POA: Diagnosis not present

## 2015-11-18 DIAGNOSIS — Z7984 Long term (current) use of oral hypoglycemic drugs: Secondary | ICD-10-CM | POA: Diagnosis not present

## 2015-11-18 DIAGNOSIS — Y9389 Activity, other specified: Secondary | ICD-10-CM | POA: Diagnosis not present

## 2015-11-18 DIAGNOSIS — Z794 Long term (current) use of insulin: Secondary | ICD-10-CM | POA: Diagnosis not present

## 2015-11-18 DIAGNOSIS — Y998 Other external cause status: Secondary | ICD-10-CM | POA: Diagnosis not present

## 2015-11-18 DIAGNOSIS — Z87891 Personal history of nicotine dependence: Secondary | ICD-10-CM | POA: Insufficient documentation

## 2015-11-18 DIAGNOSIS — Z880 Allergy status to penicillin: Secondary | ICD-10-CM | POA: Diagnosis not present

## 2015-11-18 DIAGNOSIS — Z79899 Other long term (current) drug therapy: Secondary | ICD-10-CM | POA: Diagnosis not present

## 2015-11-18 DIAGNOSIS — Y92410 Unspecified street and highway as the place of occurrence of the external cause: Secondary | ICD-10-CM | POA: Insufficient documentation

## 2015-11-18 DIAGNOSIS — Z87448 Personal history of other diseases of urinary system: Secondary | ICD-10-CM | POA: Diagnosis not present

## 2015-11-18 DIAGNOSIS — S3992XA Unspecified injury of lower back, initial encounter: Secondary | ICD-10-CM | POA: Diagnosis present

## 2015-11-18 DIAGNOSIS — E785 Hyperlipidemia, unspecified: Secondary | ICD-10-CM | POA: Diagnosis not present

## 2015-11-18 DIAGNOSIS — M545 Low back pain, unspecified: Secondary | ICD-10-CM

## 2015-11-18 DIAGNOSIS — Z7951 Long term (current) use of inhaled steroids: Secondary | ICD-10-CM | POA: Diagnosis not present
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
## 2015-11-18 NOTE — ED Notes (Signed)
[employer] at bedside to speak to patient
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
## 2015-11-18 NOTE — Discharge Instructions (Signed)

## 2015-11-18 NOTE — ED Notes (Signed)
PA at bedside.

## 2015-11-18 NOTE — ED Notes (Signed)
Patient able to ambulate independently  

## 2016-06-14 ENCOUNTER — Emergency Department (HOSPITAL_COMMUNITY)
Admission: EM | Admit: 2016-06-14 | Discharge: 2016-06-14 | Disposition: A | Discharge status: Home / Self Care | Payer: Managed Care, Other (non HMO) | Attending: Emergency Medicine | Admitting: Emergency Medicine

## 2016-06-14 ENCOUNTER — Emergency Department (HOSPITAL_COMMUNITY): Payer: Managed Care, Other (non HMO)

## 2016-06-14 ENCOUNTER — Encounter (HOSPITAL_COMMUNITY): Payer: Self-pay | Admitting: Emergency Medicine

## 2016-06-14 DIAGNOSIS — I10 Essential (primary) hypertension: Secondary | ICD-10-CM | POA: Insufficient documentation

## 2016-06-14 DIAGNOSIS — R109 Unspecified abdominal pain: Secondary | ICD-10-CM

## 2016-06-14 DIAGNOSIS — Z794 Long term (current) use of insulin: Secondary | ICD-10-CM | POA: Diagnosis not present

## 2016-06-14 DIAGNOSIS — E119 Type 2 diabetes mellitus without complications: Secondary | ICD-10-CM | POA: Insufficient documentation

## 2016-06-14 DIAGNOSIS — R319 Hematuria, unspecified: Secondary | ICD-10-CM | POA: Diagnosis present

## 2016-06-14 DIAGNOSIS — R3129 Other microscopic hematuria: Secondary | ICD-10-CM | POA: Insufficient documentation

## 2016-06-14 DIAGNOSIS — Z7984 Long term (current) use of oral hypoglycemic drugs: Secondary | ICD-10-CM | POA: Insufficient documentation

## 2016-06-14 DIAGNOSIS — Z79899 Other long term (current) drug therapy: Secondary | ICD-10-CM | POA: Insufficient documentation

## 2016-06-14 DIAGNOSIS — Z87891 Personal history of nicotine dependence: Secondary | ICD-10-CM | POA: Insufficient documentation

## 2016-06-14 LAB — URINALYSIS, ROUTINE W REFLEX MICROSCOPIC
Bilirubin Urine: NEGATIVE
GLUCOSE, UA: NEGATIVE mg/dL
Ketones, ur: NEGATIVE mg/dL
Nitrite: NEGATIVE
PH: 5.5 (ref 5.0–8.0)
PROTEIN: 30 mg/dL — AB
Specific Gravity, Urine: 1.022 (ref 1.005–1.030)

## 2016-06-14 LAB — COMPREHENSIVE METABOLIC PANEL
ALT: 16 U/L (ref 14–54)
AST: 15 U/L (ref 15–41)
Albumin: 4.3 g/dL (ref 3.5–5.0)
Alkaline Phosphatase: 60 U/L (ref 38–126)
Anion gap: 10 (ref 5–15)
BUN: 15 mg/dL (ref 6–20)
CHLORIDE: 104 mmol/L (ref 101–111)
CO2: 25 mmol/L (ref 22–32)
CREATININE: 0.97 mg/dL (ref 0.44–1.00)
Calcium: 9.8 mg/dL (ref 8.9–10.3)
GFR calc non Af Amer: >60 mL/min (ref >60)
Glucose, Bld: 161 mg/dL — ABNORMAL HIGH (ref 65–99)
Potassium: 3.7 mmol/L (ref 3.5–5.1)
SODIUM: 139 mmol/L (ref 135–145)
Total Bilirubin: 0.4 mg/dL (ref 0.3–1.2)
Total Protein: 7 g/dL (ref 6.5–8.1)

## 2016-06-14 LAB — CBC
HEMATOCRIT: 42.1 % (ref 36.0–46.0)
Hemoglobin: 14.3 g/dL (ref 12.0–15.0)
MCH: 30.4 pg (ref 26.0–34.0)
MCHC: 34 g/dL (ref 30.0–36.0)
MCV: 89.6 fL (ref 78.0–100.0)
PLATELETS: 415 10*3/uL — AB (ref 150–400)
RBC: 4.7 MIL/uL (ref 3.87–5.11)
RDW: 13.4 % (ref 11.5–15.5)
WBC: 14.7 10*3/uL — ABNORMAL HIGH (ref 4.0–10.5)

## 2016-06-14 LAB — URINE MICROSCOPIC-ADD ON

## 2016-06-14 LAB — LIPASE, BLOOD: LIPASE: 39 U/L (ref 11–51)

## 2016-06-14 LAB — POC URINE PREG, ED: Preg Test, Ur: NEGATIVE

## 2016-06-14 MED ORDER — ONDANSETRON 4 MG PO TBDP
ORAL_TABLET | ORAL | Status: AC
  Filled 2016-06-14: qty 1

## 2016-06-14 MED ORDER — LIDOCAINE 4 % EX CREA: 1.0000 "application " | TOPICAL_CREAM | Freq: Three times a day (TID) | CUTANEOUS | 0 refills | Status: DC | PRN

## 2016-06-14 MED ORDER — MORPHINE SULFATE (PF) 4 MG/ML IV SOLN
4.0000 mg | Freq: Once | INTRAVENOUS | Status: AC
  Administered 2016-06-14: 4 mg via INTRAVENOUS
  Filled 2016-06-14: qty 1

## 2016-06-14 MED ORDER — METHOCARBAMOL 500 MG PO TABS: 1000.0000 mg | ORAL_TABLET | Freq: Four times a day (QID) | ORAL | 0 refills | Status: DC | PRN

## 2016-06-14 MED ORDER — ONDANSETRON HCL 4 MG/2ML IJ SOLN
4.0000 mg | Freq: Once | INTRAMUSCULAR | Status: AC
  Administered 2016-06-14: 4 mg via INTRAVENOUS
  Filled 2016-06-14: qty 2

## 2016-06-14 MED ORDER — IOPAMIDOL (ISOVUE-300) INJECTION 61%
INTRAVENOUS | Status: AC
  Filled 2016-06-14: qty 100

## 2016-06-14 MED ORDER — ONDANSETRON 4 MG PO TBDP
4.0000 mg | ORAL_TABLET | Freq: Once | ORAL | Status: AC | PRN
  Administered 2016-06-14: 4 mg via ORAL

## 2016-06-14 NOTE — Discharge Instructions (Signed)

## 2016-06-14 NOTE — ED Triage Notes (Signed)
Pt c/o R flank pain and traces of red in her urine. Pt in severe pain.
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
## 2016-06-14 NOTE — ED Notes (Signed)
Patient at CT

## 2016-06-14 NOTE — ED Notes (Signed)
ED Provider at bedside. 

## 2016-06-14 NOTE — ED Notes (Signed)
Pt ambulatory w/ steady gait to room.

## 2016-06-16 ENCOUNTER — Encounter: Payer: Self-pay | Admitting: Internal Medicine

## 2016-07-01 ENCOUNTER — Other Ambulatory Visit: Payer: Self-pay | Admitting: Family Medicine

## 2016-07-01 DIAGNOSIS — M5430 Sciatica, unspecified side: Secondary | ICD-10-CM

## 2016-07-11 ENCOUNTER — Ambulatory Visit
Admission: RE | Admit: 2016-07-11 | Discharge: 2016-07-11 | Disposition: A | Discharge status: Home / Self Care | Payer: Managed Care, Other (non HMO) | Source: Ambulatory Visit | Attending: Family Medicine | Admitting: Family Medicine

## 2016-07-11 DIAGNOSIS — M5430 Sciatica, unspecified side: Secondary | ICD-10-CM

## 2016-07-17 NOTE — Progress Notes (Deleted)
   Subjective:    Patient ID: Madeline Simpson, female    DOB: 1961-03-05, 55 y.o.   MRN: [id number]  HPI pt is referred by Dr Yevette Edwards, for diabetes.  Pt states DM was dx'ed in; she has mild if any neuropathy of the lower extremities; she is unaware of any associated chronic complications; she has been on insulin since; pt says her diet and exercise are; she has never had GDM, pancreatitis, severe hypoglycemia or DKA.   Review of Systems denies weight loss, blurry vision, headache, chest pain, sob, n/v, urinary frequency, muscle cramps, excessive diaphoresis, memory loss, depression, cold intolerance, rhinorrhea, and easy bruising     Objective:   Physical Exam VS: see vs page GEN: no distress HEAD: head: no deformity eyes: no periorbital swelling, no proptosis external nose and ears are normal mouth: no lesion seen NECK: supple, thyroid is not enlarged CHEST WALL: no deformity LUNGS: clear to auscultation CV: reg rate and rhythm, no murmur ABD: abdomen is soft, nontender.  no hepatosplenomegaly.  not distended.  no hernia MUSCULOSKELETAL: muscle bulk and strength are grossly normal.  no obvious joint swelling.  gait is normal and steady EXTEMITIES: no deformity.  no ulcer on the feet.  feet are of normal color and temp.  no edema PULSES: dorsalis pedis intact bilat.  no carotid bruit NEURO:  cn 2-12 grossly intact.   readily moves all 4's.  sensation is intact to touch on the feet SKIN:  Normal texture and temperature.  No rash or suspicious lesion is visible.   NODES:  None palpable at the neck PSYCH: alert, well-oriented.  Does not appear anxious nor depressed.      Assessment & Plan:

## 2016-07-19 ENCOUNTER — Ambulatory Visit: Payer: BLUE CROSS/BLUE SHIELD | Admitting: Endocrinology

## 2016-07-19 DIAGNOSIS — Z0289 Encounter for other administrative examinations: Secondary | ICD-10-CM

## 2016-08-16 ENCOUNTER — Ambulatory Visit: Payer: Managed Care, Other (non HMO) | Attending: Family Medicine | Admitting: Physical Therapy

## 2016-08-16 DIAGNOSIS — M5441 Lumbago with sciatica, right side: Secondary | ICD-10-CM | POA: Insufficient documentation

## 2016-08-16 DIAGNOSIS — M6281 Muscle weakness (generalized): Secondary | ICD-10-CM | POA: Insufficient documentation

## 2016-08-16 DIAGNOSIS — G8929 Other chronic pain: Secondary | ICD-10-CM | POA: Insufficient documentation

## 2016-08-16 DIAGNOSIS — M5442 Lumbago with sciatica, left side: Secondary | ICD-10-CM | POA: Diagnosis present

## 2016-08-16 NOTE — Patient Instructions (Signed)
  Limit sitting to no more than 30-45 minutes.  Short walks.        Ruben Im PT [facility] [address], [place], [place] [address] Phone # [phone] Fax [phone]
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
## 2016-08-30 ENCOUNTER — Encounter: Payer: Managed Care, Other (non HMO) | Admitting: Physical Therapy

## 2016-09-01 ENCOUNTER — Encounter: Payer: Managed Care, Other (non HMO) | Admitting: Physical Therapy

## 2016-09-06 ENCOUNTER — Encounter: Payer: Managed Care, Other (non HMO) | Admitting: Physical Therapy

## 2016-09-08 ENCOUNTER — Encounter: Payer: Managed Care, Other (non HMO) | Admitting: Physical Therapy

## 2016-09-14 ENCOUNTER — Encounter: Payer: Self-pay | Admitting: Physical Therapy

## 2016-09-14 ENCOUNTER — Ambulatory Visit: Payer: Managed Care, Other (non HMO) | Admitting: Physical Therapy

## 2016-09-14 DIAGNOSIS — M6281 Muscle weakness (generalized): Secondary | ICD-10-CM

## 2016-09-14 DIAGNOSIS — G8929 Other chronic pain: Secondary | ICD-10-CM

## 2016-09-14 DIAGNOSIS — M5441 Lumbago with sciatica, right side: Secondary | ICD-10-CM | POA: Diagnosis not present

## 2016-09-14 DIAGNOSIS — M5442 Lumbago with sciatica, left side: Secondary | ICD-10-CM

## 2016-09-14 NOTE — Patient Instructions (Signed)
Knee-to-Chest Stretch: Unilateral    With hand behind right knee, pull knee in to chest until a comfortable stretch is felt in lower back and buttocks. Keep back relaxed. Hold ____ seconds. Repeat ____ times per set. Do ____ sets per session. Do ____ sessions per day.  http://orth.exer.us/127   #2 Kitchen sink stretch.  Hold onto the kitchen sink or any countertop. Walk the legs back and sit your hips back, head going between the arms. Hold 3 breaths, do this 3x, 3x day. Then repeat but walk your hands to the left side, isolating the right low back. Do this side 3x 3 breaths.    #3: Seated hamstring stretch: Sit at the edge of the seat, RT leg out straight with the heel bone on the flor, toes back. Hands on the opposite thigh for support, hinge forward until you get a nice, easy stretch in the back of your leg. Hold 3 breaths and do 3x, 2x day.   Copyright  VHI. All rights reserved.
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
## 2016-10-04 ENCOUNTER — Ambulatory Visit: Payer: Managed Care, Other (non HMO) | Admitting: Physical Therapy

## 2016-12-08 ENCOUNTER — Other Ambulatory Visit: Payer: Self-pay | Admitting: Family Medicine

## 2016-12-08 DIAGNOSIS — N6452 Nipple discharge: Secondary | ICD-10-CM

## 2016-12-13 ENCOUNTER — Other Ambulatory Visit: Payer: Self-pay | Admitting: Family Medicine

## 2016-12-13 DIAGNOSIS — N6452 Nipple discharge: Secondary | ICD-10-CM

## 2017-03-29 ENCOUNTER — Encounter (HOSPITAL_COMMUNITY): Payer: Self-pay | Admitting: Emergency Medicine

## 2017-03-29 ENCOUNTER — Emergency Department (HOSPITAL_COMMUNITY): Payer: Self-pay

## 2017-03-29 ENCOUNTER — Emergency Department (HOSPITAL_COMMUNITY)
Admission: EM | Admit: 2017-03-29 | Discharge: 2017-03-29 | Disposition: A | Discharge status: Home / Self Care | Payer: Self-pay | Attending: Emergency Medicine | Admitting: Emergency Medicine

## 2017-03-29 DIAGNOSIS — M25511 Pain in right shoulder: Secondary | ICD-10-CM | POA: Insufficient documentation

## 2017-03-29 DIAGNOSIS — Z79899 Other long term (current) drug therapy: Secondary | ICD-10-CM | POA: Insufficient documentation

## 2017-03-29 DIAGNOSIS — M545 Low back pain: Secondary | ICD-10-CM | POA: Insufficient documentation

## 2017-03-29 DIAGNOSIS — F17210 Nicotine dependence, cigarettes, uncomplicated: Secondary | ICD-10-CM | POA: Insufficient documentation

## 2017-03-29 DIAGNOSIS — E119 Type 2 diabetes mellitus without complications: Secondary | ICD-10-CM | POA: Insufficient documentation

## 2017-03-29 DIAGNOSIS — I10 Essential (primary) hypertension: Secondary | ICD-10-CM | POA: Insufficient documentation

## 2017-03-29 DIAGNOSIS — Z794 Long term (current) use of insulin: Secondary | ICD-10-CM | POA: Insufficient documentation

## 2017-03-29 DIAGNOSIS — R110 Nausea: Secondary | ICD-10-CM | POA: Insufficient documentation

## 2017-03-29 LAB — CBC
HEMATOCRIT: 42.8 % (ref 36.0–46.0)
Hemoglobin: 14.7 g/dL (ref 12.0–15.0)
MCH: 30.9 pg (ref 26.0–34.0)
MCHC: 34.3 g/dL (ref 30.0–36.0)
MCV: 90.1 fL (ref 78.0–100.0)
Platelets: 400 10*3/uL (ref 150–400)
RBC: 4.75 MIL/uL (ref 3.87–5.11)
RDW: 13.1 % (ref 11.5–15.5)
WBC: 12.1 10*3/uL — AB (ref 4.0–10.5)

## 2017-03-29 LAB — C-REACTIVE PROTEIN: CRP: 0.8 mg/dL (ref <1.0)

## 2017-03-29 LAB — I-STAT TROPONIN, ED: Troponin i, poc: 0.01 ng/mL (ref 0.00–0.08)

## 2017-03-29 LAB — BASIC METABOLIC PANEL
ANION GAP: 9 (ref 5–15)
BUN: 12 mg/dL (ref 6–20)
CHLORIDE: 103 mmol/L (ref 101–111)
CO2: 25 mmol/L (ref 22–32)
Calcium: 9.5 mg/dL (ref 8.9–10.3)
Creatinine, Ser: 0.73 mg/dL (ref 0.44–1.00)
GFR calc Af Amer: >60 mL/min (ref >60)
GLUCOSE: 147 mg/dL — AB (ref 65–99)
POTASSIUM: 3.3 mmol/L — AB (ref 3.5–5.1)
Sodium: 137 mmol/L (ref 135–145)

## 2017-03-29 LAB — SEDIMENTATION RATE: SED RATE: 25 mm/h — AB (ref 0–22)

## 2017-03-29 MED ORDER — FENTANYL CITRATE (PF) 100 MCG/2ML IJ SOLN
50.0000 ug | Freq: Once | INTRAMUSCULAR | Status: AC
  Administered 2017-03-29: 50 ug via INTRAVENOUS
  Filled 2017-03-29: qty 2

## 2017-03-29 MED ORDER — HYDROCODONE-ACETAMINOPHEN 5-325 MG PO TABS: 1.0000 | ORAL_TABLET | ORAL | 0 refills | Status: DC | PRN

## 2017-03-29 MED ORDER — KETOROLAC TROMETHAMINE 30 MG/ML IJ SOLN
15.0000 mg | Freq: Once | INTRAMUSCULAR | Status: AC
  Administered 2017-03-29: 15 mg via INTRAVENOUS
  Filled 2017-03-29: qty 1

## 2017-03-29 MED ORDER — MORPHINE SULFATE (PF) 4 MG/ML IV SOLN
4.0000 mg | Freq: Once | INTRAVENOUS | Status: AC
  Administered 2017-03-29: 4 mg via INTRAVENOUS
  Filled 2017-03-29: qty 1

## 2017-03-29 MED ORDER — ONDANSETRON HCL 4 MG/2ML IJ SOLN
4.0000 mg | Freq: Once | INTRAMUSCULAR | Status: AC
  Administered 2017-03-29: 4 mg via INTRAVENOUS
  Filled 2017-03-29: qty 2

## 2017-03-29 NOTE — ED Notes (Signed)
Pt taken to xray 

## 2017-03-29 NOTE — ED Triage Notes (Signed)
Pt c/o right sided chest pain that radiates to the right arm that started yesterday. Pt also c/o sob, dizziness, and n/v.
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
## 2017-04-05 ENCOUNTER — Ambulatory Visit: Payer: Self-pay | Admitting: Orthopaedic Surgery

## 2017-05-02 IMAGING — MR MR LUMBAR SPINE W/O CM
5 series · 45 of 48 positions shown · non-contrast
Comparison: None.

CLINICAL DATA: Chronic sciatica symptoms. Right low back pain
extending down the right leg for 1.5 years.

EXAM:
MRI LUMBAR SPINE WITHOUT CONTRAST
TECHNIQUE: Multiplanar, multisequence MR imaging of the lumbar spine was
performed. No intravenous contrast was administered.

[Series 3: tirm sag · sagittal · 4.0mm · 0.55mm/px · 6 of 13 slices shown]
[im 1/13]
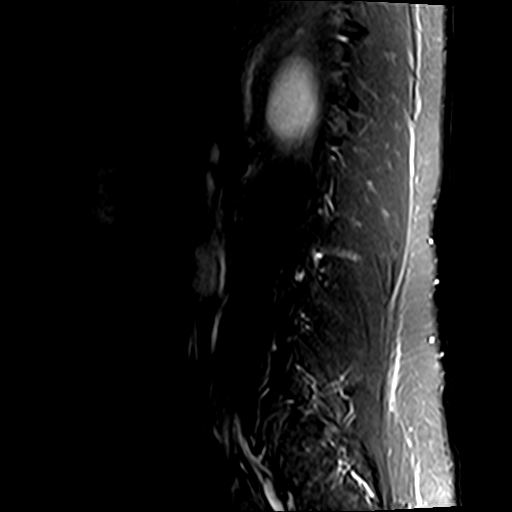
[im 3/13]
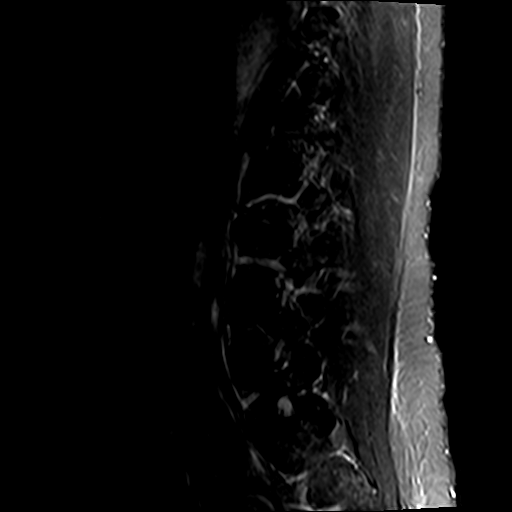
[im 5/13]
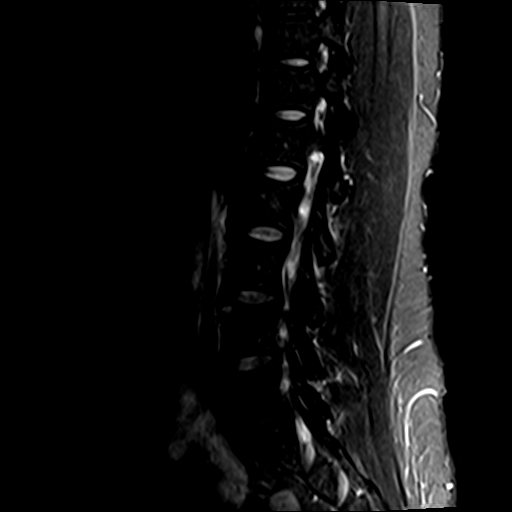
[im 8/13]
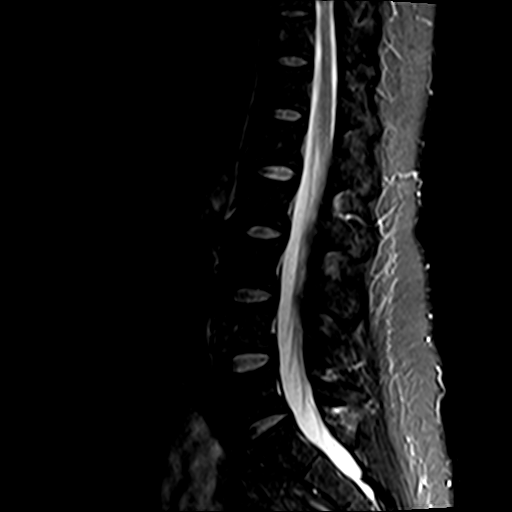
[im 10/13]
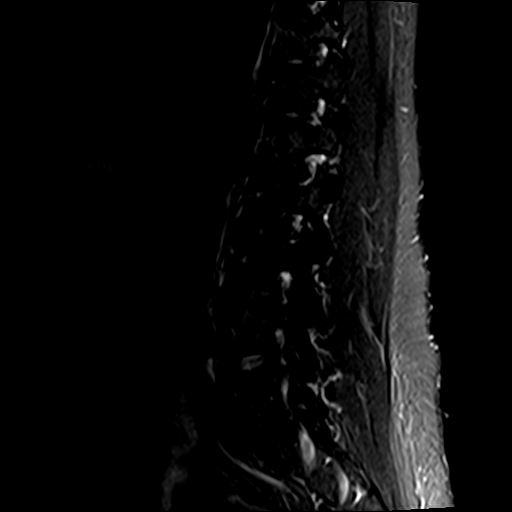
[im 13/13]
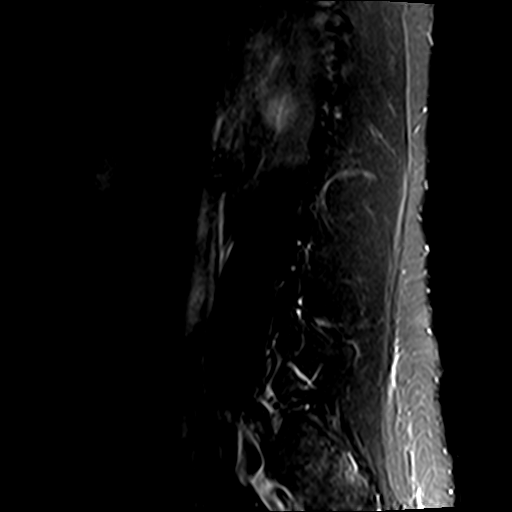

[Series 4: T2 · sagittal · 4.0mm · 0.88mm/px · 6 of 13 slices shown (1 of 2)]
[im 1/13]
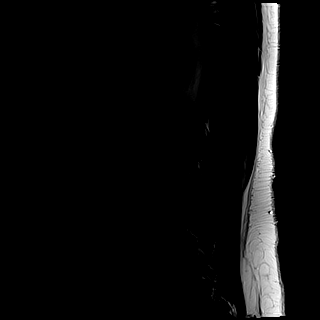
[im 3/13]
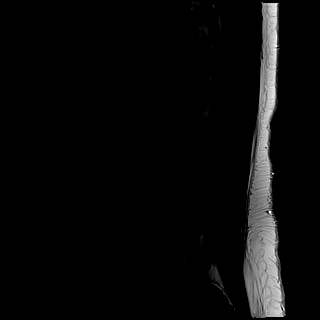
[im 5/13]
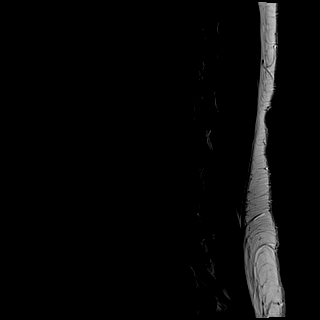
[im 8/13]
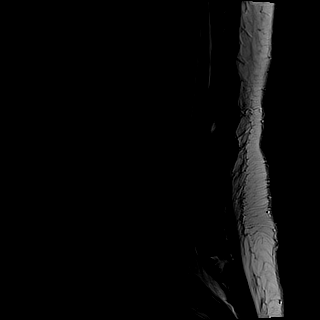
[im 10/13]
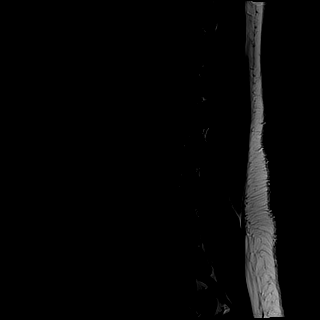
[im 13/13]
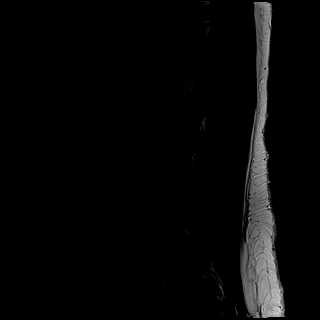

[Series 5: T1 · sagittal · 4.0mm · 0.88mm/px · 6 of 13 slices shown (1 of 2)]
[im 1/13]
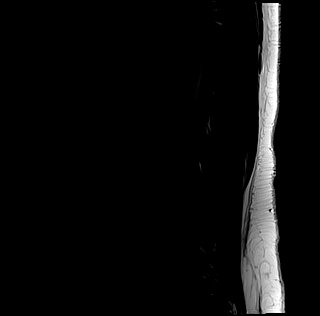
[im 3/13]
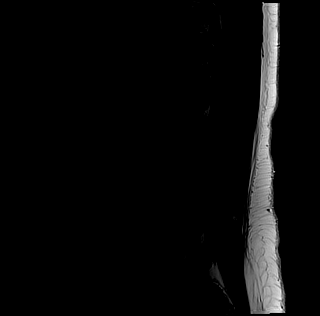
[im 5/13]
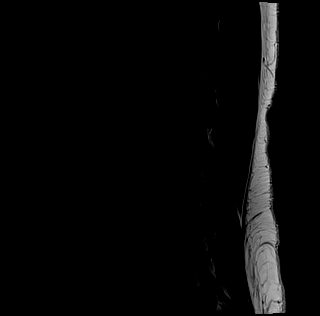
[im 8/13]
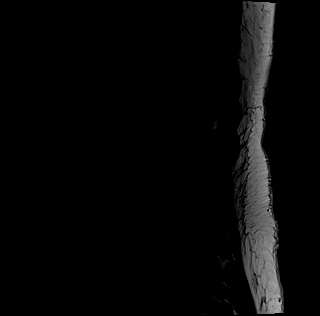
[im 10/13]
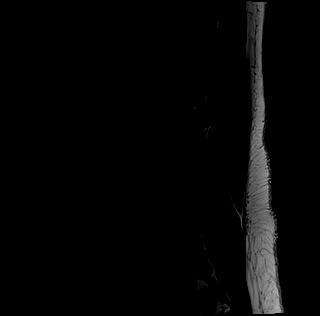
[im 13/13]
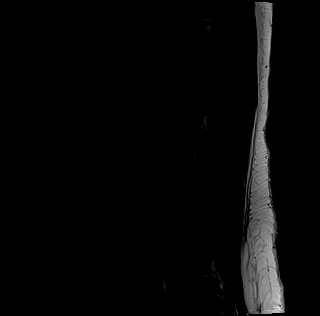

[Series 6: T1 · axial · 4.0mm · 0.78mm/px · z∈[-42,+135]mm · 12 of 31 slices shown (2 of 2)]
[im 1/31]
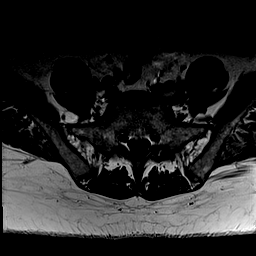
[im 3/31]
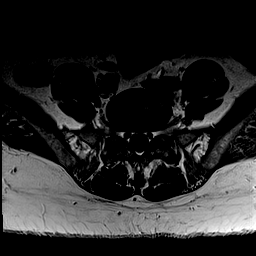
[im 5/31]
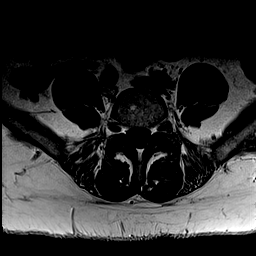
[im 7/31]
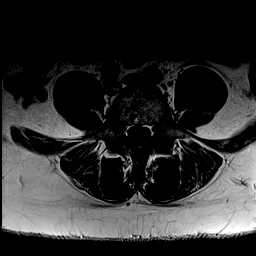
[im 9/31]
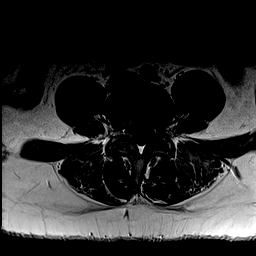
[im 11/31]
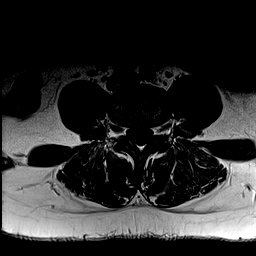
[im 13/31]
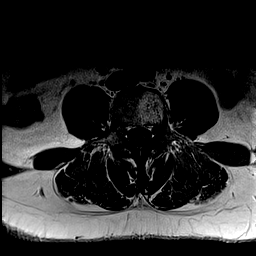
[im 16/31]
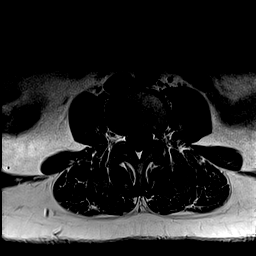
[im 18/31]
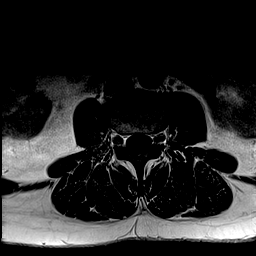
[im 22/31]
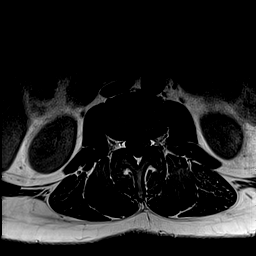
[im 26/31]
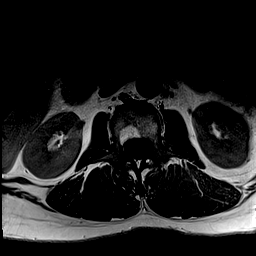
[im 31/31]
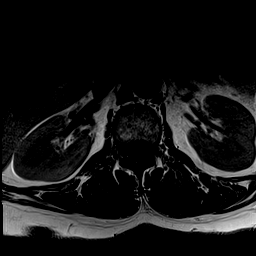

[Series 7: T2 · axial · 4.0mm · 0.78mm/px · z∈[-42,+135]mm · 15 of 31 slices shown (2 of 2)]
[im 1/31]
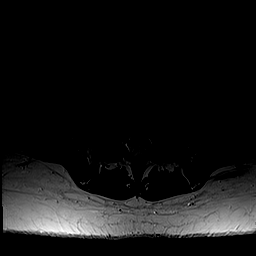
[im 3/31]
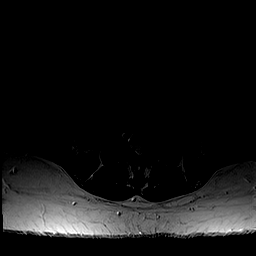
[im 5/31]
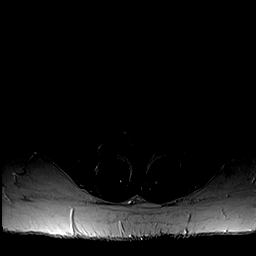
[im 7/31]
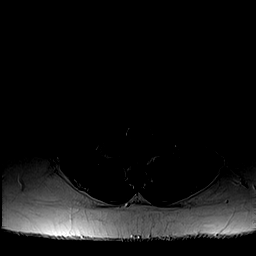
[im 9/31]
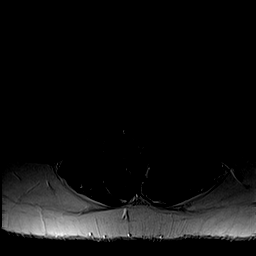
[im 11/31]
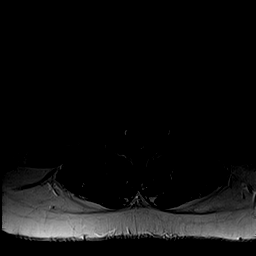
[im 13/31]
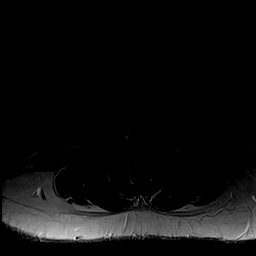
[im 16/31]
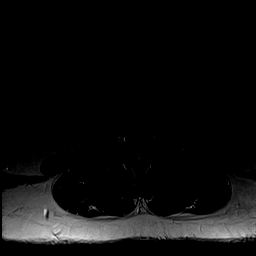
[im 18/31]
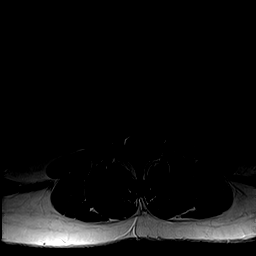
[im 20/31]
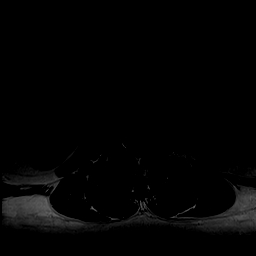
[im 22/31]
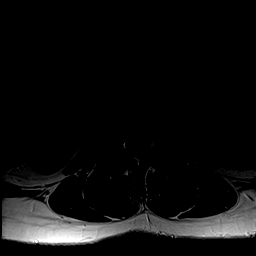
[im 24/31]
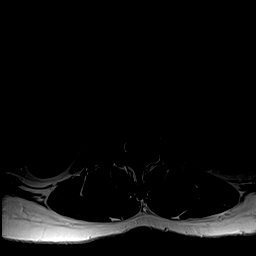
[im 26/31]
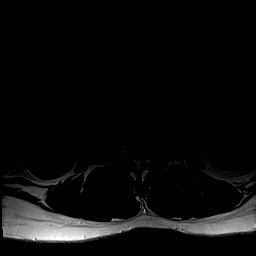
[im 28/31]
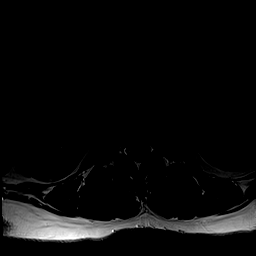
[im 31/31]
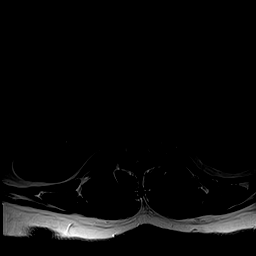

[45 of 48 positions shown; findings below may reference images not displayed]

FINDINGS: Segmentation:  Standard.

Alignment:  Physiologic.

Vertebrae: L2 and L4 hemangiomas. No evidence of fracture, discitis,
or aggressive lesion.

Conus medullaris: Extends to the L1 level and appears normal.

Paraspinal and other soft tissues: Negative

Disc levels:

T12- L1: Unremarkable.

L1-L2: Unremarkable.

L2-L3: Minimal ventral endplate spurring. Borderline facet spurring.
No impingement

L3-L4: Negative disc.  Borderline facet spurring.  No impingement.

L4-L5: Negative disc. Facet arthropathy with mild spurring. No
impingement.

L5-S1:Negative disc. Facet arthropathy with moderate spurring. No
impingement
IMPRESSION: 1. No impingement to explain right leg symptoms.
2. Facet arthropathy, mild to moderate at L4-5 and L5-S1.

## 2018-01-04 ENCOUNTER — Other Ambulatory Visit: Payer: Self-pay

## 2018-01-04 ENCOUNTER — Emergency Department (HOSPITAL_BASED_OUTPATIENT_CLINIC_OR_DEPARTMENT_OTHER)
Admission: EM | Admit: 2018-01-04 | Discharge: 2018-01-04 | Disposition: A | Discharge status: Home / Self Care | Payer: No Typology Code available for payment source | Attending: Emergency Medicine | Admitting: Emergency Medicine

## 2018-01-04 ENCOUNTER — Encounter (HOSPITAL_BASED_OUTPATIENT_CLINIC_OR_DEPARTMENT_OTHER): Payer: Self-pay

## 2018-01-04 ENCOUNTER — Other Ambulatory Visit: Payer: Self-pay | Admitting: Emergency Medicine

## 2018-01-04 DIAGNOSIS — Z87891 Personal history of nicotine dependence: Secondary | ICD-10-CM | POA: Insufficient documentation

## 2018-01-04 DIAGNOSIS — E785 Hyperlipidemia, unspecified: Secondary | ICD-10-CM | POA: Insufficient documentation

## 2018-01-04 DIAGNOSIS — R739 Hyperglycemia, unspecified: Secondary | ICD-10-CM

## 2018-01-04 DIAGNOSIS — Z79899 Other long term (current) drug therapy: Secondary | ICD-10-CM | POA: Diagnosis not present

## 2018-01-04 DIAGNOSIS — Z794 Long term (current) use of insulin: Secondary | ICD-10-CM | POA: Insufficient documentation

## 2018-01-04 DIAGNOSIS — E1165 Type 2 diabetes mellitus with hyperglycemia: Secondary | ICD-10-CM | POA: Diagnosis present

## 2018-01-04 DIAGNOSIS — I10 Essential (primary) hypertension: Secondary | ICD-10-CM | POA: Diagnosis not present

## 2018-01-04 LAB — URINALYSIS, ROUTINE W REFLEX MICROSCOPIC
Bilirubin Urine: NEGATIVE
Glucose, UA: >=500 mg/dL — AB
Ketones, ur: NEGATIVE mg/dL
LEUKOCYTES UA: NEGATIVE
Nitrite: NEGATIVE
PROTEIN: NEGATIVE mg/dL
Specific Gravity, Urine: 1.01 (ref 1.005–1.030)
pH: 5.5 (ref 5.0–8.0)

## 2018-01-04 LAB — COMPREHENSIVE METABOLIC PANEL
ALK PHOS: 64 U/L (ref 38–126)
ALT: 14 U/L (ref 14–54)
AST: 16 U/L (ref 15–41)
Albumin: 3.9 g/dL (ref 3.5–5.0)
Anion gap: 12 (ref 5–15)
BILIRUBIN TOTAL: 0.4 mg/dL (ref 0.3–1.2)
BUN: 12 mg/dL (ref 6–20)
CALCIUM: 9.4 mg/dL (ref 8.9–10.3)
CO2: 23 mmol/L (ref 22–32)
Chloride: 101 mmol/L (ref 101–111)
Creatinine, Ser: 0.76 mg/dL (ref 0.44–1.00)
GFR calc non Af Amer: >60 mL/min (ref >60)
Glucose, Bld: 329 mg/dL — ABNORMAL HIGH (ref 65–99)
Potassium: 3.8 mmol/L (ref 3.5–5.1)
SODIUM: 136 mmol/L (ref 135–145)
TOTAL PROTEIN: 7.5 g/dL (ref 6.5–8.1)

## 2018-01-04 LAB — CBC WITH DIFFERENTIAL/PLATELET
BASOS PCT: 1 %
Basophils Absolute: 0.1 10*3/uL (ref 0.0–0.1)
EOS ABS: 0.1 10*3/uL (ref 0.0–0.7)
Eosinophils Relative: 1 %
HCT: 38.7 % (ref 36.0–46.0)
HEMOGLOBIN: 13.6 g/dL (ref 12.0–15.0)
Lymphocytes Relative: 25 %
Lymphs Abs: 2.7 10*3/uL (ref 0.7–4.0)
MCH: 31.3 pg (ref 26.0–34.0)
MCHC: 35.1 g/dL (ref 30.0–36.0)
MCV: 89.2 fL (ref 78.0–100.0)
MONO ABS: 1 10*3/uL (ref 0.1–1.0)
MONOS PCT: 9 %
NEUTROS PCT: 64 %
Neutro Abs: 6.7 10*3/uL (ref 1.7–7.7)
Platelets: 471 10*3/uL — ABNORMAL HIGH (ref 150–400)
RBC: 4.34 MIL/uL (ref 3.87–5.11)
RDW: 12.7 % (ref 11.5–15.5)
WBC: 10.5 10*3/uL (ref 4.0–10.5)

## 2018-01-04 LAB — CBG MONITORING, ED
GLUCOSE-CAPILLARY: 105 mg/dL — AB (ref 65–99)
Glucose-Capillary: 391 mg/dL — ABNORMAL HIGH (ref 65–99)

## 2018-01-04 LAB — URINALYSIS, MICROSCOPIC (REFLEX)

## 2018-01-04 MED ORDER — SODIUM CHLORIDE 0.9 % IV BOLUS
1000.0000 mL | Freq: Once | INTRAVENOUS | Status: AC
  Administered 2018-01-04: 1000 mL via INTRAVENOUS

## 2018-01-04 MED ORDER — ACETAMINOPHEN 325 MG PO TABS
650.0000 mg | ORAL_TABLET | Freq: Once | ORAL | Status: AC
Start: 2018-01-04 — End: 2018-01-04
  Administered 2018-01-04: 650 mg via ORAL
  Filled 2018-01-04: qty 2

## 2018-01-04 MED ORDER — INSULIN ASPART 100 UNIT/ML IV SOLN
10.0000 [IU] | Freq: Once | INTRAVENOUS | Status: AC
  Administered 2018-01-04: 10 [IU] via INTRAVENOUS
  Filled 2018-01-04: qty 1

## 2018-01-04 NOTE — ED Notes (Signed)
RN KP informed of Pts Sugar reading
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
## 2018-03-01 ENCOUNTER — Other Ambulatory Visit: Payer: Self-pay

## 2018-03-01 ENCOUNTER — Encounter (HOSPITAL_COMMUNITY): Payer: Self-pay | Admitting: Emergency Medicine

## 2018-03-01 ENCOUNTER — Emergency Department (HOSPITAL_COMMUNITY)
Admission: EM | Admit: 2018-03-01 | Discharge: 2018-03-01 | Disposition: A | Discharge status: Home / Self Care | Payer: No Typology Code available for payment source | Attending: Emergency Medicine | Admitting: Emergency Medicine

## 2018-03-01 DIAGNOSIS — Z79899 Other long term (current) drug therapy: Secondary | ICD-10-CM | POA: Insufficient documentation

## 2018-03-01 DIAGNOSIS — E1165 Type 2 diabetes mellitus with hyperglycemia: Secondary | ICD-10-CM | POA: Insufficient documentation

## 2018-03-01 DIAGNOSIS — I10 Essential (primary) hypertension: Secondary | ICD-10-CM | POA: Insufficient documentation

## 2018-03-01 DIAGNOSIS — R739 Hyperglycemia, unspecified: Secondary | ICD-10-CM | POA: Diagnosis present

## 2018-03-01 DIAGNOSIS — S025XXA Fracture of tooth (traumatic), initial encounter for closed fracture: Secondary | ICD-10-CM | POA: Insufficient documentation

## 2018-03-01 DIAGNOSIS — Y9389 Activity, other specified: Secondary | ICD-10-CM | POA: Diagnosis not present

## 2018-03-01 DIAGNOSIS — F17210 Nicotine dependence, cigarettes, uncomplicated: Secondary | ICD-10-CM | POA: Diagnosis not present

## 2018-03-01 DIAGNOSIS — X58XXXA Exposure to other specified factors, initial encounter: Secondary | ICD-10-CM | POA: Insufficient documentation

## 2018-03-01 DIAGNOSIS — Y999 Unspecified external cause status: Secondary | ICD-10-CM | POA: Diagnosis not present

## 2018-03-01 DIAGNOSIS — Y9289 Other specified places as the place of occurrence of the external cause: Secondary | ICD-10-CM | POA: Insufficient documentation

## 2018-03-01 DIAGNOSIS — Z794 Long term (current) use of insulin: Secondary | ICD-10-CM | POA: Diagnosis not present

## 2018-03-01 DIAGNOSIS — S0993XA Unspecified injury of face, initial encounter: Secondary | ICD-10-CM

## 2018-03-01 LAB — BLOOD GAS, VENOUS
ACID-BASE DEFICIT: 4 mmol/L — AB (ref 0.0–2.0)
Bicarbonate: 27.6 mmol/L (ref 20.0–28.0)
FIO2: 21
O2 Saturation: 96.3 %
Patient temperature: 37
pCO2, Ven: 35.7 mmHg — ABNORMAL LOW (ref 44.0–60.0)
pH, Ven: 7.373 (ref 7.250–7.430)
pO2, Ven: 88.7 mmHg — ABNORMAL HIGH (ref 32.0–45.0)

## 2018-03-01 LAB — COMPREHENSIVE METABOLIC PANEL
ALT: 16 U/L (ref 0–44)
AST: 31 U/L (ref 15–41)
Albumin: 3.9 g/dL (ref 3.5–5.0)
Alkaline Phosphatase: 66 U/L (ref 38–126)
Anion gap: 14 (ref 5–15)
BUN: 15 mg/dL (ref 6–20)
CHLORIDE: 101 mmol/L (ref 98–111)
CO2: 17 mmol/L — AB (ref 22–32)
CREATININE: 1.11 mg/dL — AB (ref 0.44–1.00)
Calcium: 9.1 mg/dL (ref 8.9–10.3)
GFR, EST NON AFRICAN AMERICAN: 55 mL/min — AB (ref >60)
Glucose, Bld: 480 mg/dL — ABNORMAL HIGH (ref 70–99)
POTASSIUM: 5.9 mmol/L — AB (ref 3.5–5.1)
Sodium: 132 mmol/L — ABNORMAL LOW (ref 135–145)
Total Bilirubin: 1.5 mg/dL — ABNORMAL HIGH (ref 0.3–1.2)
Total Protein: 6.4 g/dL — ABNORMAL LOW (ref 6.5–8.1)

## 2018-03-01 LAB — URINALYSIS, ROUTINE W REFLEX MICROSCOPIC
BILIRUBIN URINE: NEGATIVE
Glucose, UA: >=500 mg/dL — AB
HGB URINE DIPSTICK: NEGATIVE
KETONES UR: 5 mg/dL — AB
LEUKOCYTES UA: NEGATIVE
NITRITE: NEGATIVE
PROTEIN: NEGATIVE mg/dL
SPECIFIC GRAVITY, URINE: 1.018 (ref 1.005–1.030)
pH: 5 (ref 5.0–8.0)

## 2018-03-01 LAB — CBC WITH DIFFERENTIAL/PLATELET
BASOS PCT: 0 %
Basophils Absolute: 0 10*3/uL (ref 0.0–0.1)
EOS ABS: 0.2 10*3/uL (ref 0.0–0.7)
EOS PCT: 2 %
HCT: 37.9 % (ref 36.0–46.0)
HEMOGLOBIN: 13 g/dL (ref 12.0–15.0)
Lymphocytes Relative: 27 %
Lymphs Abs: 2.9 10*3/uL (ref 0.7–4.0)
MCH: 30.7 pg (ref 26.0–34.0)
MCHC: 34.3 g/dL (ref 30.0–36.0)
MCV: 89.6 fL (ref 78.0–100.0)
MONOS PCT: 7 %
Monocytes Absolute: 0.7 10*3/uL (ref 0.1–1.0)
NEUTROS PCT: 64 %
Neutro Abs: 7.1 10*3/uL (ref 1.7–7.7)
PLATELETS: 395 10*3/uL (ref 150–400)
RBC: 4.23 MIL/uL (ref 3.87–5.11)
RDW: 13.3 % (ref 11.5–15.5)
WBC: 11 10*3/uL — ABNORMAL HIGH (ref 4.0–10.5)

## 2018-03-01 LAB — CBG MONITORING, ED
GLUCOSE-CAPILLARY: 471 mg/dL — AB (ref 70–99)
GLUCOSE-CAPILLARY: 346 mg/dL — AB (ref 70–99)

## 2018-03-01 MED ORDER — SODIUM CHLORIDE 0.9 % IV BOLUS
1000.0000 mL | Freq: Once | INTRAVENOUS | Status: AC
  Administered 2018-03-01: 1000 mL via INTRAVENOUS

## 2018-03-01 MED ORDER — ACETAMINOPHEN 325 MG PO TABS
650.0000 mg | ORAL_TABLET | Freq: Once | ORAL | Status: DC
  Filled 2018-03-01: qty 2

## 2018-03-01 MED ORDER — KETOROLAC TROMETHAMINE 30 MG/ML IJ SOLN
15.0000 mg | Freq: Once | INTRAMUSCULAR | Status: AC
  Administered 2018-03-01: 15 mg via INTRAVENOUS
  Filled 2018-03-01: qty 1

## 2018-03-01 NOTE — ED Triage Notes (Signed)
Pain to rt side of mouth since lunch today, states she thinks she cracked her tooth.
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
## 2018-03-01 NOTE — ED Notes (Signed)
Pt reports after triage that she figured her sugar was high because she has been feeling bad today

## 2018-03-01 NOTE — ED Notes (Signed)
Attempted x 2 for IV access without success, asked CN to attempt for IV and blood

## 2018-03-01 NOTE — ED Notes (Signed)
Respiratory called for venous blood gas

## 2018-03-01 NOTE — Discharge Instructions (Addendum)
Be sure to take your diabetes medicines as instructed.  If you develop worsening fatigue, dizziness, or if you develop vomiting, chest pain, shortness of breath, headache or other new/concerning symptoms or return to the ER for evaluation.  Stop taking your lisinopril as your potassium is elevated. Your potassium will need to be rechecked at your doctor's office next week

## 2018-03-01 NOTE — ED Notes (Signed)
cbg 471

## 2018-03-01 NOTE — ED Notes (Signed)
Pt requesting something stronger than tylenol, EDP made aware

## 2018-03-19 ENCOUNTER — Encounter (HOSPITAL_COMMUNITY): Payer: Self-pay

## 2018-03-19 ENCOUNTER — Other Ambulatory Visit: Payer: Self-pay

## 2018-03-19 ENCOUNTER — Emergency Department (HOSPITAL_COMMUNITY)
Admission: EM | Admit: 2018-03-19 | Discharge: 2018-03-19 | Disposition: A | Discharge status: Home / Self Care | Payer: No Typology Code available for payment source | Attending: Emergency Medicine | Admitting: Emergency Medicine

## 2018-03-19 DIAGNOSIS — Z794 Long term (current) use of insulin: Secondary | ICD-10-CM | POA: Diagnosis not present

## 2018-03-19 DIAGNOSIS — Z79899 Other long term (current) drug therapy: Secondary | ICD-10-CM | POA: Diagnosis not present

## 2018-03-19 DIAGNOSIS — E119 Type 2 diabetes mellitus without complications: Secondary | ICD-10-CM | POA: Insufficient documentation

## 2018-03-19 DIAGNOSIS — K0889 Other specified disorders of teeth and supporting structures: Secondary | ICD-10-CM | POA: Diagnosis not present

## 2018-03-19 DIAGNOSIS — F17210 Nicotine dependence, cigarettes, uncomplicated: Secondary | ICD-10-CM | POA: Diagnosis not present

## 2018-03-19 DIAGNOSIS — I10 Essential (primary) hypertension: Secondary | ICD-10-CM | POA: Diagnosis not present

## 2018-03-19 MED ORDER — CLINDAMYCIN HCL 150 MG PO CAPS: 150.0000 mg | ORAL_CAPSULE | Freq: Three times a day (TID) | ORAL | 0 refills | Status: DC

## 2018-03-19 MED ORDER — MELOXICAM 15 MG PO TABS: 15.0000 mg | ORAL_TABLET | Freq: Every day | ORAL | 0 refills | Status: DC

## 2018-03-19 NOTE — ED Triage Notes (Signed)
Pt endorses right dental/gum pain x 1 month. Pt went to [facility] recently for the same. Has not seen a dentist.

## 2018-03-19 NOTE — Discharge Instructions (Addendum)
Please return to the ED if you experience any of the following symptoms:  Pain with eye movement Facial swelling worsens Fever,chest pain or shortness of breath
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
## 2018-03-19 NOTE — ED Notes (Signed)
Declined W/C at D/C and was escorted to lobby by RN. 

## 2018-08-31 ENCOUNTER — Emergency Department (HOSPITAL_COMMUNITY)
Admission: EM | Admit: 2018-08-31 | Discharge: 2018-08-31 | Disposition: A | Discharge status: Home / Self Care | Payer: Self-pay | Attending: Emergency Medicine | Admitting: Emergency Medicine

## 2018-08-31 ENCOUNTER — Emergency Department (HOSPITAL_COMMUNITY): Payer: Self-pay

## 2018-08-31 ENCOUNTER — Encounter (HOSPITAL_COMMUNITY): Payer: Self-pay

## 2018-08-31 ENCOUNTER — Other Ambulatory Visit: Payer: Self-pay

## 2018-08-31 DIAGNOSIS — Z79899 Other long term (current) drug therapy: Secondary | ICD-10-CM | POA: Insufficient documentation

## 2018-08-31 DIAGNOSIS — E1165 Type 2 diabetes mellitus with hyperglycemia: Secondary | ICD-10-CM | POA: Insufficient documentation

## 2018-08-31 DIAGNOSIS — F17210 Nicotine dependence, cigarettes, uncomplicated: Secondary | ICD-10-CM | POA: Insufficient documentation

## 2018-08-31 DIAGNOSIS — R739 Hyperglycemia, unspecified: Secondary | ICD-10-CM

## 2018-08-31 DIAGNOSIS — E876 Hypokalemia: Secondary | ICD-10-CM | POA: Insufficient documentation

## 2018-08-31 DIAGNOSIS — R1011 Right upper quadrant pain: Secondary | ICD-10-CM | POA: Insufficient documentation

## 2018-08-31 DIAGNOSIS — Z794 Long term (current) use of insulin: Secondary | ICD-10-CM | POA: Insufficient documentation

## 2018-08-31 DIAGNOSIS — R072 Precordial pain: Secondary | ICD-10-CM | POA: Insufficient documentation

## 2018-08-31 DIAGNOSIS — N644 Mastodynia: Secondary | ICD-10-CM | POA: Insufficient documentation

## 2018-08-31 DIAGNOSIS — I10 Essential (primary) hypertension: Secondary | ICD-10-CM | POA: Insufficient documentation

## 2018-08-31 LAB — COMPREHENSIVE METABOLIC PANEL
ALT: 13 U/L (ref 0–44)
AST: 13 U/L — AB (ref 15–41)
Albumin: 4.1 g/dL (ref 3.5–5.0)
Alkaline Phosphatase: 64 U/L (ref 38–126)
Anion gap: 7 (ref 5–15)
BUN: 27 mg/dL — AB (ref 6–20)
CO2: 24 mmol/L (ref 22–32)
Calcium: 9.4 mg/dL (ref 8.9–10.3)
Chloride: 105 mmol/L (ref 98–111)
Creatinine, Ser: 0.91 mg/dL (ref 0.44–1.00)
Glucose, Bld: 365 mg/dL — ABNORMAL HIGH (ref 70–99)
POTASSIUM: 3.4 mmol/L — AB (ref 3.5–5.1)
Sodium: 136 mmol/L (ref 135–145)
Total Bilirubin: 0.5 mg/dL (ref 0.3–1.2)
Total Protein: 7 g/dL (ref 6.5–8.1)

## 2018-08-31 LAB — URINALYSIS, ROUTINE W REFLEX MICROSCOPIC
BILIRUBIN URINE: NEGATIVE
Hgb urine dipstick: NEGATIVE
Ketones, ur: NEGATIVE mg/dL
NITRITE: NEGATIVE
PH: 5 (ref 5.0–8.0)
Protein, ur: 30 mg/dL — AB
SPECIFIC GRAVITY, URINE: 1.028 (ref 1.005–1.030)

## 2018-08-31 LAB — CBC
HCT: 42.5 % (ref 36.0–46.0)
HEMOGLOBIN: 13.8 g/dL (ref 12.0–15.0)
MCH: 30.5 pg (ref 26.0–34.0)
MCHC: 32.5 g/dL (ref 30.0–36.0)
MCV: 94 fL (ref 80.0–100.0)
NRBC: 0 % (ref 0.0–0.2)
Platelets: 417 10*3/uL — ABNORMAL HIGH (ref 150–400)
RBC: 4.52 MIL/uL (ref 3.87–5.11)
RDW: 12.8 % (ref 11.5–15.5)
WBC: 11.5 10*3/uL — AB (ref 4.0–10.5)

## 2018-08-31 LAB — TROPONIN I: Troponin I: <0.03 ng/mL (ref <0.03)

## 2018-08-31 LAB — LIPASE, BLOOD: Lipase: 43 U/L (ref 11–51)

## 2018-08-31 LAB — CBG MONITORING, ED
GLUCOSE-CAPILLARY: 323 mg/dL — AB (ref 70–99)
Glucose-Capillary: 197 mg/dL — ABNORMAL HIGH (ref 70–99)

## 2018-08-31 MED ORDER — METFORMIN HCL 500 MG PO TABS: 500.0000 mg | ORAL_TABLET | Freq: Two times a day (BID) | ORAL | 2 refills | Status: DC

## 2018-08-31 MED ORDER — SODIUM CHLORIDE 0.9% FLUSH: 3.0000 mL | Freq: Once | INTRAVENOUS | Status: DC

## 2018-08-31 MED ORDER — HYDROMORPHONE HCL 4 MG PO TABS: 4.0000 mg | ORAL_TABLET | Freq: Four times a day (QID) | ORAL | 0 refills | Status: DC | PRN

## 2018-08-31 MED ORDER — FENTANYL CITRATE (PF) 100 MCG/2ML IJ SOLN
25.0000 ug | Freq: Once | INTRAMUSCULAR | Status: AC
  Administered 2018-08-31: 25 ug via INTRAVENOUS
  Filled 2018-08-31: qty 2

## 2018-08-31 MED ORDER — SODIUM CHLORIDE 0.9 % IV BOLUS
1000.0000 mL | Freq: Once | INTRAVENOUS | Status: AC
  Administered 2018-08-31: 1000 mL via INTRAVENOUS

## 2018-08-31 MED ORDER — HYDROCHLOROTHIAZIDE 25 MG PO TABS: 25.0000 mg | ORAL_TABLET | Freq: Every day | ORAL | 2 refills | Status: DC

## 2018-08-31 MED ORDER — AMLODIPINE BESYLATE 5 MG PO TABS: 5.0000 mg | ORAL_TABLET | Freq: Every day | ORAL | 2 refills | Status: AC

## 2018-08-31 MED ORDER — INSULIN ASPART 100 UNIT/ML ~~LOC~~ SOLN
10.0000 [IU] | Freq: Once | SUBCUTANEOUS | Status: AC
  Administered 2018-08-31: 10 [IU] via INTRAVENOUS
  Filled 2018-08-31: qty 1

## 2018-08-31 MED ORDER — ONDANSETRON HCL 4 MG/2ML IJ SOLN
4.0000 mg | Freq: Once | INTRAMUSCULAR | Status: AC
  Administered 2018-08-31: 4 mg via INTRAVENOUS
  Filled 2018-08-31: qty 2

## 2018-08-31 MED ORDER — IOPAMIDOL (ISOVUE-300) INJECTION 61%
100.0000 mL | Freq: Once | INTRAVENOUS | Status: AC | PRN
  Administered 2018-08-31: 100 mL via INTRAVENOUS

## 2018-08-31 MED ORDER — SODIUM CHLORIDE 0.9 % IV SOLN
INTRAVENOUS | Status: DC
  Administered 2018-08-31: 20:00:00 via INTRAVENOUS

## 2018-08-31 NOTE — ED Notes (Signed)
Urine spec to lab 

## 2018-08-31 NOTE — ED Notes (Signed)
Dr Earnest Conroy has seen

## 2018-08-31 NOTE — ED Triage Notes (Signed)
Pt is out of her diabetic medication, Metformin and Insulin. Neuropathy is acting up and pt also has sharp pain in her stomach. Pain ongoing for the last week.

## 2018-08-31 NOTE — ED Notes (Signed)
Awaiting eval from EDP

## 2018-08-31 NOTE — ED Notes (Signed)
Pt informed of need for urine specimen. She stated "I gave them one in Triage."

## 2018-08-31 NOTE — ED Notes (Signed)
Pt request prescription for pain meds and to go home  Dr Melvyn Novas

## 2018-08-31 NOTE — Discharge Instructions (Signed)
Have renewed your blood pressure medicine, Norvasc and hydro-chlorothiazide..  The hydrochlorothiazide can lower your potassium so eat foods high in potassium potassium was slightly low today already.  Will need that rechecked in a couple weeks.  Start your metformin and continue your insulin.  Short course of hydromorphone provided to help with the pain.  Return for any new or worse symptoms.
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
## 2018-08-31 NOTE — ED Notes (Signed)
Awaiting eval  

## 2018-08-31 NOTE — ED Notes (Signed)
Pt is complaining regarding the wait   She has been spoken to by Octavia Bruckner, RN, [facility] As well as the pharm tech regarding where she can get her meds other than the ED due to pt unable/unwilling to get appointments at [facility] and/or health Dept for her primary Care

## 2018-08-31 NOTE — ED Notes (Signed)
Meal provided 

## 2018-08-31 NOTE — ED Notes (Signed)
Hassan Rowan, RN asked to start IV due to pt anger and distress

## 2018-08-31 NOTE — ED Notes (Signed)
Pt is terse and defensive in her behaviors She reports trying to go to health dept and not being seen due to "no income" Went to [facility] and "it was closed"  Seen here for her care and meds needs and reports she has been in the waiting room for the last 3 hours and she is in pain  She is out of meds, complains of abd pain, reports her sister and father have died in the last 70 months of [place],  That she has "leakage" from her breast

## 2018-08-31 NOTE — ED Notes (Signed)
From CT 

## 2018-09-02 LAB — URINE CULTURE

## 2018-11-04 ENCOUNTER — Encounter (HOSPITAL_COMMUNITY): Payer: Self-pay | Admitting: Emergency Medicine

## 2018-11-04 ENCOUNTER — Other Ambulatory Visit: Payer: Self-pay

## 2018-11-04 ENCOUNTER — Emergency Department (HOSPITAL_COMMUNITY)
Admission: EM | Admit: 2018-11-04 | Discharge: 2018-11-04 | Disposition: A | Discharge status: Home / Self Care | Payer: No Typology Code available for payment source | Attending: Emergency Medicine | Admitting: Emergency Medicine

## 2018-11-04 DIAGNOSIS — G9009 Other idiopathic peripheral autonomic neuropathy: Secondary | ICD-10-CM | POA: Insufficient documentation

## 2018-11-04 DIAGNOSIS — Z79899 Other long term (current) drug therapy: Secondary | ICD-10-CM | POA: Insufficient documentation

## 2018-11-04 DIAGNOSIS — Z794 Long term (current) use of insulin: Secondary | ICD-10-CM | POA: Insufficient documentation

## 2018-11-04 DIAGNOSIS — M792 Neuralgia and neuritis, unspecified: Secondary | ICD-10-CM

## 2018-11-04 DIAGNOSIS — E119 Type 2 diabetes mellitus without complications: Secondary | ICD-10-CM | POA: Insufficient documentation

## 2018-11-04 DIAGNOSIS — I10 Essential (primary) hypertension: Secondary | ICD-10-CM | POA: Insufficient documentation

## 2018-11-04 DIAGNOSIS — F17210 Nicotine dependence, cigarettes, uncomplicated: Secondary | ICD-10-CM | POA: Insufficient documentation

## 2018-11-04 MED ORDER — TRAMADOL HCL 50 MG PO TABS: 50.0000 mg | ORAL_TABLET | Freq: Three times a day (TID) | ORAL | 0 refills | Status: DC | PRN

## 2018-11-04 MED ORDER — GABAPENTIN 100 MG PO CAPS: 200.0000 mg | ORAL_CAPSULE | Freq: Three times a day (TID) | ORAL | 0 refills | Status: DC
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
## 2018-11-04 NOTE — Discharge Instructions (Addendum)
Thank you for sharing your story with me. It is encouraging to hear patients successfully making the changes you have. Keep up the hard work!  Hopefully the neurontin will help with your symptoms. The dosage may potentially be increased depending on your response to it. Since you have not been on it previously, I am starting you on a dose that is on the lower range of what can be used.. Be careful because it may make your drowsy. I would prefer that you use NSAIDs like ibuprofen, aleve, etc very sparingly. If you feel like you need something for pain then take the tramadol as needed.

## 2018-11-04 NOTE — ED Triage Notes (Signed)
Patient reports low blood sugar that caused her "to kind of lose consciousness on Friday." Patient states she has neuropathic pain from working in a cold environment.

## 2018-11-04 NOTE — ED Triage Notes (Signed)
Patient states her CBG this am was 119.

## 2019-01-28 ENCOUNTER — Other Ambulatory Visit: Payer: Self-pay

## 2019-01-28 ENCOUNTER — Telehealth: Payer: Self-pay

## 2019-01-28 DIAGNOSIS — Z20822 Contact with and (suspected) exposure to covid-19: Secondary | ICD-10-CM

## 2019-01-28 NOTE — Telephone Encounter (Signed)
Call was dropped from office staff ANN. Call place to [facility]. Left VM for ann to return call for scheduling of patient.

## 2019-01-28 NOTE — Telephone Encounter (Signed)
Dr. Aviva Kluver at [facility] request COVID 19 for symptoms. [phone]. Fax [phone].

## 2019-01-29 DIAGNOSIS — E1121 Type 2 diabetes mellitus with diabetic nephropathy: Secondary | ICD-10-CM | POA: Diagnosis not present

## 2019-01-29 DIAGNOSIS — Z20828 Contact with and (suspected) exposure to other viral communicable diseases: Secondary | ICD-10-CM | POA: Diagnosis not present

## 2019-01-31 LAB — NOVEL CORONAVIRUS, NAA: SARS-CoV-2, NAA: NOT DETECTED

## 2019-02-26 ENCOUNTER — Other Ambulatory Visit (HOSPITAL_COMMUNITY): Payer: Self-pay | Admitting: Family Medicine

## 2019-02-26 DIAGNOSIS — N644 Mastodynia: Secondary | ICD-10-CM

## 2019-03-01 ENCOUNTER — Other Ambulatory Visit: Payer: Self-pay | Admitting: Family Medicine

## 2019-03-01 DIAGNOSIS — N644 Mastodynia: Secondary | ICD-10-CM

## 2019-03-08 ENCOUNTER — Other Ambulatory Visit: Payer: Self-pay

## 2019-03-15 ENCOUNTER — Ambulatory Visit
Admission: RE | Admit: 2019-03-15 | Discharge: 2019-03-15 | Disposition: A | Discharge status: Home / Self Care | Payer: BC Managed Care – PPO | Source: Ambulatory Visit | Attending: Family Medicine | Admitting: Family Medicine

## 2019-03-15 ENCOUNTER — Other Ambulatory Visit: Payer: Self-pay

## 2019-03-15 ENCOUNTER — Ambulatory Visit
Admission: RE | Admit: 2019-03-15 | Discharge: 2019-03-15 | Disposition: A | Discharge status: Home / Self Care | Payer: Self-pay | Source: Ambulatory Visit | Attending: Family Medicine | Admitting: Family Medicine

## 2019-03-15 DIAGNOSIS — N644 Mastodynia: Secondary | ICD-10-CM

## 2019-03-15 DIAGNOSIS — N6002 Solitary cyst of left breast: Secondary | ICD-10-CM | POA: Diagnosis not present

## 2019-03-15 DIAGNOSIS — N6011 Diffuse cystic mastopathy of right breast: Secondary | ICD-10-CM | POA: Diagnosis not present

## 2019-03-15 DIAGNOSIS — R922 Inconclusive mammogram: Secondary | ICD-10-CM | POA: Diagnosis not present

## 2019-04-23 DIAGNOSIS — R202 Paresthesia of skin: Secondary | ICD-10-CM | POA: Diagnosis not present

## 2019-04-23 DIAGNOSIS — Z6831 Body mass index (BMI) 31.0-31.9, adult: Secondary | ICD-10-CM | POA: Diagnosis not present

## 2019-04-23 DIAGNOSIS — G629 Polyneuropathy, unspecified: Secondary | ICD-10-CM | POA: Diagnosis not present

## 2019-04-23 DIAGNOSIS — E1121 Type 2 diabetes mellitus with diabetic nephropathy: Secondary | ICD-10-CM | POA: Diagnosis not present

## 2019-04-23 DIAGNOSIS — L609 Nail disorder, unspecified: Secondary | ICD-10-CM | POA: Diagnosis not present

## 2019-04-23 DIAGNOSIS — Z23 Encounter for immunization: Secondary | ICD-10-CM | POA: Diagnosis not present

## 2019-04-23 DIAGNOSIS — I10 Essential (primary) hypertension: Secondary | ICD-10-CM | POA: Diagnosis not present

## 2019-04-23 DIAGNOSIS — Z79899 Other long term (current) drug therapy: Secondary | ICD-10-CM | POA: Diagnosis not present

## 2019-04-23 DIAGNOSIS — E7800 Pure hypercholesterolemia, unspecified: Secondary | ICD-10-CM | POA: Diagnosis not present

## 2019-06-03 ENCOUNTER — Encounter: Payer: Self-pay | Admitting: Neurology

## 2019-06-03 ENCOUNTER — Ambulatory Visit: Payer: BC Managed Care – PPO | Admitting: Neurology

## 2019-06-03 ENCOUNTER — Ambulatory Visit (INDEPENDENT_AMBULATORY_CARE_PROVIDER_SITE_OTHER): Payer: BC Managed Care – PPO | Admitting: Neurology

## 2019-06-03 ENCOUNTER — Other Ambulatory Visit: Payer: Self-pay

## 2019-06-03 DIAGNOSIS — G5603 Carpal tunnel syndrome, bilateral upper limbs: Secondary | ICD-10-CM

## 2019-06-03 HISTORY — DX: Carpal tunnel syndrome, bilateral upper limbs: G56.03

## 2019-06-03 NOTE — Progress Notes (Signed)
[place]    Nerve / Sites Muscle Latency Ref. Amplitude Ref. Rel Amp Segments Distance Velocity Ref. Area    ms ms mV mV %  cm m/s m/s mVms  R Median - APB     Wrist APB 7.4 ?4.4 6.5 ?4.0 100 Wrist - APB 7   21.9     Upper arm APB 12.1  6.1  93.4 Upper arm - Wrist 23 49 ?49 21.2  L Median - APB     Wrist APB 7.8 ?4.4 6.3 ?4.0 100 Wrist - APB 7   23.0     Upper arm APB 12.5  5.6  87.9 Upper arm - Wrist 23 49 ?49 22.7  R Ulnar - ADM     Wrist ADM 2.7 ?3.3 9.2 ?6.0 100 Wrist - ADM 7   35.4     B.Elbow ADM 6.7  8.2  88.9 B.Elbow - Wrist 20 51 ?49 32.4     A.Elbow ADM 8.7  7.8  95.4 A.Elbow - B.Elbow 10 49 ?49 31.7         A.Elbow - Wrist      L Ulnar - ADM     Wrist ADM 2.7 ?3.3 8.7 ?6.0 100 Wrist - ADM 7   32.4     B.Elbow ADM 6.6  7.5  86.7 B.Elbow - Wrist 20 52 ?49 32.1     A.Elbow ADM 8.6  7.5  99.3 A.Elbow - B.Elbow 10 49 ?49 31.6         A.Elbow - Wrist                 SNC    Nerve / Sites Rec. Site Peak Lat Ref.  Amp Ref. Segments Distance Peak Diff Ref.    ms ms V V  cm ms ms  R Median, Ulnar - Transcarpal comparison     Median Palm Wrist 3.3 ?2.2 8 ?35 Median Palm - Wrist 8       Ulnar Palm Wrist 2.0 ?2.2 14 ?12 Ulnar Palm - Wrist 8          Median Palm - Ulnar Palm  1.3 ?0.4  L Median, Ulnar - Transcarpal comparison     Median Palm Wrist 4.0 ?2.2 9 ?35 Median Palm - Wrist 8       Ulnar Palm Wrist 1.9 ?2.2 16 ?12 Ulnar Palm - Wrist 8          Median Palm - Ulnar Palm  2.1 ?0.4  R Median - Orthodromic (Dig II, Mid palm)     Dig II Wrist NR ?3.4 NR ?10 Dig II - Wrist 13    L Median - Orthodromic (Dig II, Mid palm)     Dig II Wrist NR ?3.4 NR ?10 Dig II - Wrist 13    R Ulnar - Orthodromic, (Dig V, Mid palm)     Dig V Wrist 2.9 ?3.1 7 ?5 Dig V - Wrist 11    L Ulnar - Orthodromic, (Dig V, Mid palm)     Dig V Wrist 2.6 ?3.1 6 ?5 Dig V - Wrist 72                   F  Wave    Nerve F Lat Ref.   ms ms  R Ulnar - ADM 30.4 ?32.0  L Ulnar - ADM 31.0 ?32.0

## 2019-06-03 NOTE — Procedures (Signed)
     HISTORY:  Madeline Simpson is a 58 year old patient with a history of diabetes who has noted discomfort and numbness in both hands beginning in February 2020, but the symptoms have worsened over the last 2 months.  She does have some slight neck pain but no pain down the arms on either side.  She is being evaluated for a possible neuropathy or a cervical radiculopathy.  She does have some numbness and tingling in both feet.  NERVE CONDUCTION STUDIES:  Nerve conduction studies were performed on both upper extremities.  The distal motor latencies for the median nerves were prolonged bilaterally but with normal motor amplitudes for these nerves bilaterally.  The distal motor latencies and motor amplitudes for the ulnar nerves were normal bilaterally with normal nerve conduction velocities seen for the median and ulnar nerves bilaterally.  The sensory latencies for the median nerves were unobtainable bilaterally, normal for the ulnar nerves bilaterally.  The F-wave latencies for the ulnar nerves were normal bilaterally.   EMG STUDIES:  EMG study was performed on the left upper extremity:  The first dorsal interosseous muscle reveals 2 to 4 K units with full recruitment. No fibrillations or positive waves were noted. The abductor pollicis brevis muscle reveals 2 to 4 K units with slightly reduced recruitment. No fibrillations or positive waves were noted. The extensor indicis proprius muscle reveals 1 to 3 K units with full recruitment. No fibrillations or positive waves were noted. The pronator teres muscle reveals 2 to 3 K units with full recruitment. No fibrillations or positive waves were noted. The biceps muscle reveals 1 to 2 K units with full recruitment. No fibrillations or positive waves were noted. The triceps muscle reveals 2 to 4 K units with full recruitment. No fibrillations or positive waves were noted. The anterior deltoid muscle reveals 2 to 3 K units with full recruitment. No  fibrillations or positive waves were noted. The cervical paraspinal muscles were tested at 2 levels. No abnormalities of insertional activity were seen at either level tested. There was good relaxation.   IMPRESSION:  Nerve conduction studies done on both upper extremities shows evidence of bilateral carpal tunnel syndrome of mild to moderate severity.  EMG evaluation of the left upper extremity was unremarkable, without evidence of an overlying cervical radiculopathy.  Jill Alexanders MD 06/03/2019 9:07 AM  [facility] [address] [place], [place] [address]  Phone [phone] Fax [phone]

## 2019-06-28 DIAGNOSIS — G5603 Carpal tunnel syndrome, bilateral upper limbs: Secondary | ICD-10-CM | POA: Diagnosis not present

## 2019-06-28 DIAGNOSIS — D72829 Elevated white blood cell count, unspecified: Secondary | ICD-10-CM | POA: Diagnosis not present

## 2019-06-28 DIAGNOSIS — G629 Polyneuropathy, unspecified: Secondary | ICD-10-CM | POA: Diagnosis not present

## 2019-06-28 DIAGNOSIS — E113293 Type 2 diabetes mellitus with mild nonproliferative diabetic retinopathy without macular edema, bilateral: Secondary | ICD-10-CM | POA: Diagnosis not present

## 2019-07-08 DIAGNOSIS — M542 Cervicalgia: Secondary | ICD-10-CM | POA: Diagnosis not present

## 2019-07-08 DIAGNOSIS — M180 Bilateral primary osteoarthritis of first carpometacarpal joints: Secondary | ICD-10-CM | POA: Insufficient documentation

## 2019-07-08 DIAGNOSIS — G5603 Carpal tunnel syndrome, bilateral upper limbs: Secondary | ICD-10-CM | POA: Diagnosis not present

## 2019-08-01 DIAGNOSIS — E11319 Type 2 diabetes mellitus with unspecified diabetic retinopathy without macular edema: Secondary | ICD-10-CM | POA: Diagnosis not present

## 2019-08-01 DIAGNOSIS — Z794 Long term (current) use of insulin: Secondary | ICD-10-CM | POA: Diagnosis not present

## 2019-08-01 DIAGNOSIS — E1140 Type 2 diabetes mellitus with diabetic neuropathy, unspecified: Secondary | ICD-10-CM | POA: Diagnosis not present

## 2019-08-01 DIAGNOSIS — E1165 Type 2 diabetes mellitus with hyperglycemia: Secondary | ICD-10-CM | POA: Diagnosis not present

## 2019-08-27 DIAGNOSIS — H179 Unspecified corneal scar and opacity: Secondary | ICD-10-CM | POA: Diagnosis not present

## 2019-08-27 DIAGNOSIS — E119 Type 2 diabetes mellitus without complications: Secondary | ICD-10-CM | POA: Diagnosis not present

## 2019-08-27 DIAGNOSIS — E113293 Type 2 diabetes mellitus with mild nonproliferative diabetic retinopathy without macular edema, bilateral: Secondary | ICD-10-CM | POA: Diagnosis not present

## 2019-09-02 ENCOUNTER — Ambulatory Visit: Payer: BC Managed Care – PPO | Admitting: Podiatry

## 2019-09-02 ENCOUNTER — Encounter: Payer: Self-pay | Admitting: Podiatry

## 2019-09-02 ENCOUNTER — Other Ambulatory Visit: Payer: Self-pay

## 2019-09-02 VITALS — Temp 98.0°F

## 2019-09-02 DIAGNOSIS — L600 Ingrowing nail: Secondary | ICD-10-CM | POA: Diagnosis not present

## 2019-09-02 MED ORDER — NEOMYCIN-POLYMYXIN-HC 3.5-10000-1 OT SOLN: OTIC | 0 refills | Status: DC

## 2019-09-02 MED ORDER — HYDROCODONE-ACETAMINOPHEN 10-325 MG PO TABS: 1.0000 | ORAL_TABLET | Freq: Three times a day (TID) | ORAL | 0 refills | Status: AC | PRN

## 2019-09-02 NOTE — Patient Instructions (Signed)

## 2019-09-02 NOTE — Progress Notes (Signed)
   Subjective:    Patient ID: Charlott Holler, female    DOB: 1961-03-20, 59 y.o.   MRN: [id number]  HPI    Review of Systems  All other systems reviewed and are negative.      Objective:   Physical Exam        Assessment & Plan:

## 2019-09-02 NOTE — Progress Notes (Signed)
Subjective:   Patient ID: Madeline Simpson, female   DOB: 59 y.o.   MRN: [id number]   HPI Patient presents stating she has a very damaged and painful left big toenail and also the fifth nail on her left to be bothersome along with the fifth right.  States that she has trouble wearing shoe gear comfortably and has tried to change her shoe gear and modify it has been unable to to be getting worse and she cannot cut it.  Patient is a diabetic under good control with last A1c being seven-point.  Patient does smoke around a pack a day and likes to be active   Review of Systems  All other systems reviewed and are negative.       Objective:  Physical Exam Vitals and nursing note reviewed.  Constitutional:      Appearance: She is well-developed.  Pulmonary:     Effort: Pulmonary effort is normal.  Musculoskeletal:        General: Normal range of motion.  Skin:    General: Skin is warm.  Neurological:     Mental Status: She is alert.     Neurovascular status intact muscle strength found to be adequate range of motion within normal limits.  Patient is found to have a severely thickened left hallux nail that is dystrophic and painful and hard for her to wear shoe gear with along with the fifth nail left with mild changes in the fifth right but easier to cut.  Patient has excellent digital perfusion toes are warm and is well oriented x3     Assessment:  Severe damage to the left hallux nail and fifth nail left with pain upon pressure to both and some thickness right fifth is not bad     Plan:  H&P spent a great deal time going over treatment options and due to the severity of the damage this will not respond to oral medication or laser topical.  Patient is opted for nail removal after I educated her and I allowed her to read consent form for permanent removal of the hallux and fifth nail left foot.  Patient is willing to accept risk and after signing consent form I infiltrated each of the  digit 60 mg like Marcaine mixture sterile prep applied using sterile instrumentation I remove the nail left hallux and left fifth exposed matrix and applied phenol 5 applications to the hallux 3 applications fifth digit followed by alcohol lavage sterile dressing.  Gave instructions on soaks and reappoint encouraged her to call with questions and to leave dressings on 24 hours but take it off earlier if any throbbing were to occur and wrote prescription for drops and hydrocodone to be taken as needed with patient stating she has not had allergy to hydrocodone and has taken it in the past

## 2019-09-03 DIAGNOSIS — E11319 Type 2 diabetes mellitus with unspecified diabetic retinopathy without macular edema: Secondary | ICD-10-CM | POA: Diagnosis not present

## 2019-09-03 DIAGNOSIS — Z794 Long term (current) use of insulin: Secondary | ICD-10-CM | POA: Diagnosis not present

## 2019-09-03 DIAGNOSIS — E1165 Type 2 diabetes mellitus with hyperglycemia: Secondary | ICD-10-CM | POA: Diagnosis not present

## 2019-09-03 DIAGNOSIS — E1140 Type 2 diabetes mellitus with diabetic neuropathy, unspecified: Secondary | ICD-10-CM | POA: Diagnosis not present

## 2019-09-04 ENCOUNTER — Telehealth: Payer: Self-pay | Admitting: Podiatry

## 2019-09-04 DIAGNOSIS — L600 Ingrowing nail: Secondary | ICD-10-CM

## 2019-09-04 NOTE — Telephone Encounter (Signed)
Patient is requesting Short term disability paperwork to be completed, with time off 09/02/2019 until 09/16/2019 due to toenail removal. (She said her job does not offer Light duty) Please advise?

## 2019-09-05 NOTE — Telephone Encounter (Signed)
I informed pt that Dr. Paulla Dolly had okayed her to be out of work 09/02/2019 - 09/16/2019 and return to work 09/16/2019 regular duty no restrictions. Pt requested note to be mailed to her and the FMLA paperwork sent to [place] and emailed to her. Mailed note to pt.

## 2019-09-05 NOTE — Telephone Encounter (Signed)
Dr. Paulla Dolly ordered pt to be out of work 09/02/2019 - 09/16/2019 due to minor surgery to B/L feet.

## 2019-09-12 ENCOUNTER — Encounter: Payer: Self-pay | Admitting: Podiatry

## 2019-10-17 DIAGNOSIS — E11319 Type 2 diabetes mellitus with unspecified diabetic retinopathy without macular edema: Secondary | ICD-10-CM | POA: Diagnosis not present

## 2019-10-17 DIAGNOSIS — E1165 Type 2 diabetes mellitus with hyperglycemia: Secondary | ICD-10-CM | POA: Diagnosis not present

## 2019-10-17 DIAGNOSIS — E1140 Type 2 diabetes mellitus with diabetic neuropathy, unspecified: Secondary | ICD-10-CM | POA: Diagnosis not present

## 2019-10-17 DIAGNOSIS — Z794 Long term (current) use of insulin: Secondary | ICD-10-CM | POA: Diagnosis not present

## 2019-10-24 ENCOUNTER — Ambulatory Visit: Payer: BC Managed Care – PPO

## 2019-10-26 ENCOUNTER — Ambulatory Visit: Payer: BC Managed Care – PPO

## 2019-11-14 ENCOUNTER — Ambulatory Visit: Payer: BC Managed Care – PPO | Attending: Internal Medicine

## 2019-11-14 DIAGNOSIS — Z23 Encounter for immunization: Secondary | ICD-10-CM

## 2019-11-14 NOTE — Progress Notes (Signed)
   Covid-19 Vaccination Clinic  Name:  VENIDA DUNKLEY    MRN: [id number] DOB: [birth year]  11/14/2019  Ms. Suastegui was observed post Covid-19 immunization for 15 minutes without incident. She was provided with Vaccine Information Sheet and instruction to access the V-Safe system.   Ms. Dahill was instructed to call 911 with any severe reactions post vaccine: Marland Kitchen Difficulty breathing  . Swelling of face and throat  . A fast heartbeat  . A bad rash all over body  . Dizziness and weakness   Immunizations Administered    Name Date Dose VIS Date Route   Moderna COVID-19 Vaccine 11/14/2019  8:17 AM 0.5 mL 07/16/2019 Intramuscular   Manufacturer: Moderna   Lot: [id number]   [id number]

## 2019-11-19 DIAGNOSIS — E1121 Type 2 diabetes mellitus with diabetic nephropathy: Secondary | ICD-10-CM | POA: Diagnosis not present

## 2019-11-19 DIAGNOSIS — G629 Polyneuropathy, unspecified: Secondary | ICD-10-CM | POA: Diagnosis not present

## 2019-11-19 DIAGNOSIS — D473 Essential (hemorrhagic) thrombocythemia: Secondary | ICD-10-CM | POA: Diagnosis not present

## 2019-11-19 DIAGNOSIS — F321 Major depressive disorder, single episode, moderate: Secondary | ICD-10-CM | POA: Diagnosis not present

## 2019-11-19 DIAGNOSIS — I10 Essential (primary) hypertension: Secondary | ICD-10-CM | POA: Diagnosis not present

## 2019-12-02 ENCOUNTER — Other Ambulatory Visit: Payer: Self-pay

## 2019-12-02 ENCOUNTER — Ambulatory Visit: Payer: BC Managed Care – PPO | Attending: Internal Medicine

## 2019-12-02 ENCOUNTER — Ambulatory Visit: Payer: Self-pay | Admitting: *Deleted

## 2019-12-02 DIAGNOSIS — Z20822 Contact with and (suspected) exposure to covid-19: Secondary | ICD-10-CM

## 2019-12-02 DIAGNOSIS — I10 Essential (primary) hypertension: Secondary | ICD-10-CM | POA: Diagnosis not present

## 2019-12-02 DIAGNOSIS — E1121 Type 2 diabetes mellitus with diabetic nephropathy: Secondary | ICD-10-CM | POA: Diagnosis not present

## 2019-12-02 DIAGNOSIS — A084 Viral intestinal infection, unspecified: Secondary | ICD-10-CM | POA: Diagnosis not present

## 2019-12-02 NOTE — Telephone Encounter (Signed)
Summary: Clinical Advice   Patient would like to speak with nurse about possibly having covid After getting her first vaccine dose. Please advise      Attempted to call patient- left message to call back

## 2019-12-02 NOTE — Telephone Encounter (Signed)
Third attempt to contact patient- left message to call back if needed- closing call after 3rd attempt.

## 2019-12-02 NOTE — Telephone Encounter (Signed)
Attempted to call patient back- left message to call back.

## 2019-12-04 LAB — NOVEL CORONAVIRUS, NAA: SARS-CoV-2, NAA: NOT DETECTED

## 2019-12-04 LAB — SARS-COV-2, NAA 2 DAY TAT

## 2019-12-17 ENCOUNTER — Ambulatory Visit: Payer: BC Managed Care – PPO | Attending: Internal Medicine

## 2019-12-17 DIAGNOSIS — Z23 Encounter for immunization: Secondary | ICD-10-CM

## 2019-12-17 NOTE — Progress Notes (Signed)
   Covid-19 Vaccination Clinic  Name:  Madeline Simpson    MRN: [id number] DOB: Dec 23, 1960  12/17/2019  [name] was observed post Covid-19 immunization for 15 minutes without incident. She was provided with Vaccine Information Sheet and instruction to access the V-Safe system.   [name] was instructed to call 911 with any severe reactions post vaccine: Marland Kitchen Difficulty breathing  . Swelling of face and throat  . A fast heartbeat  . A bad rash all over body  . Dizziness and weakness   Immunizations Administered    Name Date Dose VIS Date Route   Moderna COVID-19 Vaccine 12/17/2019  8:10 AM 0.5 mL 07/2019 Intramuscular   Manufacturer: Moderna   Lot: [id number]   [id number]

## 2019-12-25 DIAGNOSIS — M65311 Trigger thumb, right thumb: Secondary | ICD-10-CM | POA: Diagnosis not present

## 2019-12-25 DIAGNOSIS — G5603 Carpal tunnel syndrome, bilateral upper limbs: Secondary | ICD-10-CM | POA: Diagnosis not present

## 2020-01-09 ENCOUNTER — Ambulatory Visit (INDEPENDENT_AMBULATORY_CARE_PROVIDER_SITE_OTHER): Payer: BC Managed Care – PPO | Admitting: Orthopaedic Surgery

## 2020-01-09 ENCOUNTER — Encounter: Payer: Self-pay | Admitting: Orthopaedic Surgery

## 2020-01-09 ENCOUNTER — Ambulatory Visit: Payer: Self-pay

## 2020-01-09 ENCOUNTER — Other Ambulatory Visit: Payer: Self-pay

## 2020-01-09 VITALS — Ht 67.0 in | Wt 188.0 lb

## 2020-01-09 DIAGNOSIS — M4722 Other spondylosis with radiculopathy, cervical region: Secondary | ICD-10-CM

## 2020-01-09 DIAGNOSIS — M542 Cervicalgia: Secondary | ICD-10-CM
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
## 2020-01-27 ENCOUNTER — Other Ambulatory Visit: Payer: Self-pay

## 2020-01-27 ENCOUNTER — Ambulatory Visit (HOSPITAL_COMMUNITY)
Admission: RE | Admit: 2020-01-27 | Discharge: 2020-01-27 | Disposition: A | Discharge status: Home / Self Care | Payer: BC Managed Care – PPO | Source: Ambulatory Visit | Attending: Orthopaedic Surgery | Admitting: Orthopaedic Surgery

## 2020-01-27 DIAGNOSIS — M4802 Spinal stenosis, cervical region: Secondary | ICD-10-CM | POA: Diagnosis not present

## 2020-01-27 DIAGNOSIS — M4722 Other spondylosis with radiculopathy, cervical region: Secondary | ICD-10-CM | POA: Diagnosis not present

## 2020-01-27 DIAGNOSIS — M47812 Spondylosis without myelopathy or radiculopathy, cervical region: Secondary | ICD-10-CM | POA: Diagnosis not present

## 2020-02-03 ENCOUNTER — Other Ambulatory Visit (HOSPITAL_COMMUNITY): Payer: BC Managed Care – PPO

## 2020-02-06 ENCOUNTER — Other Ambulatory Visit: Payer: Self-pay

## 2020-02-06 ENCOUNTER — Ambulatory Visit: Payer: BC Managed Care – PPO | Admitting: Orthopaedic Surgery

## 2020-02-13 ENCOUNTER — Other Ambulatory Visit: Payer: Self-pay

## 2020-02-13 ENCOUNTER — Encounter: Payer: Self-pay | Admitting: Orthopaedic Surgery

## 2020-02-13 ENCOUNTER — Ambulatory Visit (INDEPENDENT_AMBULATORY_CARE_PROVIDER_SITE_OTHER): Payer: BC Managed Care – PPO | Admitting: Orthopaedic Surgery

## 2020-02-13 VITALS — Ht 67.0 in | Wt 188.0 lb

## 2020-02-13 DIAGNOSIS — M4722 Other spondylosis with radiculopathy, cervical region: Secondary | ICD-10-CM

## 2020-02-13 DIAGNOSIS — M4802 Spinal stenosis, cervical region: Secondary | ICD-10-CM | POA: Diagnosis not present

## 2020-02-13 DIAGNOSIS — M7541 Impingement syndrome of right shoulder: Secondary | ICD-10-CM

## 2020-02-13 DIAGNOSIS — G5603 Carpal tunnel syndrome, bilateral upper limbs: Secondary | ICD-10-CM | POA: Diagnosis not present

## 2020-02-13 MED ORDER — LIDOCAINE HCL 1 % IJ SOLN
0.5000 mL | INTRAMUSCULAR | Status: AC | PRN
  Administered 2020-02-13: .5 mL

## 2020-02-13 MED ORDER — METHYLPREDNISOLONE ACETATE 40 MG/ML IJ SUSP
40.0000 mg | INTRAMUSCULAR | Status: AC | PRN
Start: 2020-02-13 — End: 2020-02-13
  Administered 2020-02-13: 40 mg via INTRA_ARTICULAR

## 2020-02-13 MED ORDER — BUPIVACAINE HCL 0.25 % IJ SOLN
4.0000 mL | INTRAMUSCULAR | Status: AC | PRN
  Administered 2020-02-13: 4 mL via INTRA_ARTICULAR
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
## 2020-02-27 ENCOUNTER — Other Ambulatory Visit: Payer: Self-pay

## 2020-02-27 ENCOUNTER — Ambulatory Visit: Payer: BC Managed Care – PPO | Admitting: Orthopaedic Surgery

## 2020-02-28 ENCOUNTER — Ambulatory Visit: Payer: BC Managed Care – PPO | Admitting: Podiatry

## 2020-03-12 ENCOUNTER — Other Ambulatory Visit: Payer: Self-pay

## 2020-03-12 ENCOUNTER — Ambulatory Visit: Payer: BC Managed Care – PPO | Admitting: Orthopaedic Surgery

## 2020-04-09 ENCOUNTER — Ambulatory Visit (INDEPENDENT_AMBULATORY_CARE_PROVIDER_SITE_OTHER): Payer: BC Managed Care – PPO | Admitting: Orthopaedic Surgery

## 2020-04-09 ENCOUNTER — Other Ambulatory Visit: Payer: Self-pay

## 2020-04-09 ENCOUNTER — Encounter: Payer: Self-pay | Admitting: Orthopaedic Surgery

## 2020-04-09 VITALS — Ht 67.0 in | Wt 190.0 lb

## 2020-04-09 DIAGNOSIS — M2550 Pain in unspecified joint: Secondary | ICD-10-CM | POA: Diagnosis not present

## 2020-04-09 DIAGNOSIS — M7541 Impingement syndrome of right shoulder: Secondary | ICD-10-CM | POA: Diagnosis not present

## 2020-04-09 MED ORDER — LIDOCAINE HCL 1 % IJ SOLN
0.5000 mL | INTRAMUSCULAR | Status: AC | PRN
  Administered 2020-04-09: .5 mL

## 2020-04-09 MED ORDER — BUPIVACAINE HCL 0.25 % IJ SOLN
4.0000 mL | INTRAMUSCULAR | Status: AC | PRN
  Administered 2020-04-09: 4 mL via INTRA_ARTICULAR

## 2020-04-09 MED ORDER — METHYLPREDNISOLONE ACETATE 40 MG/ML IJ SUSP
40.0000 mg | INTRAMUSCULAR | Status: AC | PRN
  Administered 2020-04-09: 40 mg via INTRA_ARTICULAR
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
## 2020-04-15 ENCOUNTER — Other Ambulatory Visit (HOSPITAL_COMMUNITY)
Admission: RE | Admit: 2020-04-15 | Discharge: 2020-04-15 | Disposition: A | Discharge status: Home / Self Care | Payer: BC Managed Care – PPO | Source: Ambulatory Visit | Attending: Orthopaedic Surgery | Admitting: Orthopaedic Surgery

## 2020-04-15 DIAGNOSIS — M2550 Pain in unspecified joint: Secondary | ICD-10-CM | POA: Insufficient documentation

## 2020-04-15 LAB — SEDIMENTATION RATE: Sed Rate: 18 mm/hr (ref 0–22)

## 2020-04-15 LAB — URIC ACID: Uric Acid, Serum: 4.7 mg/dL (ref 2.5–7.1)

## 2020-04-16 LAB — ANA: Anti Nuclear Antibody (ANA): NEGATIVE

## 2020-04-16 LAB — RHEUMATOID FACTOR: Rheumatoid fact SerPl-aCnc: <10 IU/mL (ref 0.0–13.9)

## 2020-04-22 DIAGNOSIS — E11319 Type 2 diabetes mellitus with unspecified diabetic retinopathy without macular edema: Secondary | ICD-10-CM | POA: Diagnosis not present

## 2020-04-22 DIAGNOSIS — E1140 Type 2 diabetes mellitus with diabetic neuropathy, unspecified: Secondary | ICD-10-CM | POA: Diagnosis not present

## 2020-04-22 DIAGNOSIS — Z794 Long term (current) use of insulin: Secondary | ICD-10-CM | POA: Diagnosis not present

## 2020-04-22 DIAGNOSIS — E1165 Type 2 diabetes mellitus with hyperglycemia: Secondary | ICD-10-CM | POA: Diagnosis not present

## 2020-04-30 ENCOUNTER — Emergency Department (HOSPITAL_COMMUNITY): Payer: BC Managed Care – PPO

## 2020-04-30 ENCOUNTER — Other Ambulatory Visit: Payer: Self-pay

## 2020-04-30 ENCOUNTER — Encounter (HOSPITAL_COMMUNITY): Payer: Self-pay | Admitting: Emergency Medicine

## 2020-04-30 ENCOUNTER — Emergency Department (HOSPITAL_COMMUNITY)
Admission: EM | Admit: 2020-04-30 | Discharge: 2020-04-30 | Disposition: A | Discharge status: Home / Self Care | Payer: BC Managed Care – PPO | Attending: Emergency Medicine | Admitting: Emergency Medicine

## 2020-04-30 DIAGNOSIS — Z79899 Other long term (current) drug therapy: Secondary | ICD-10-CM | POA: Insufficient documentation

## 2020-04-30 DIAGNOSIS — Z9011 Acquired absence of right breast and nipple: Secondary | ICD-10-CM | POA: Insufficient documentation

## 2020-04-30 DIAGNOSIS — E1165 Type 2 diabetes mellitus with hyperglycemia: Secondary | ICD-10-CM | POA: Diagnosis not present

## 2020-04-30 DIAGNOSIS — G40909 Epilepsy, unspecified, not intractable, without status epilepticus: Secondary | ICD-10-CM | POA: Diagnosis not present

## 2020-04-30 DIAGNOSIS — R569 Unspecified convulsions: Secondary | ICD-10-CM | POA: Insufficient documentation

## 2020-04-30 DIAGNOSIS — F17210 Nicotine dependence, cigarettes, uncomplicated: Secondary | ICD-10-CM | POA: Diagnosis not present

## 2020-04-30 DIAGNOSIS — Z794 Long term (current) use of insulin: Secondary | ICD-10-CM | POA: Insufficient documentation

## 2020-04-30 DIAGNOSIS — I10 Essential (primary) hypertension: Secondary | ICD-10-CM | POA: Diagnosis not present

## 2020-04-30 DIAGNOSIS — E119 Type 2 diabetes mellitus without complications: Secondary | ICD-10-CM | POA: Diagnosis not present

## 2020-04-30 LAB — COMPREHENSIVE METABOLIC PANEL
ALT: 18 U/L (ref 0–44)
AST: 16 U/L (ref 15–41)
Albumin: 4.1 g/dL (ref 3.5–5.0)
Alkaline Phosphatase: 57 U/L (ref 38–126)
Anion gap: 12 (ref 5–15)
BUN: 24 mg/dL — ABNORMAL HIGH (ref 6–20)
CO2: 23 mmol/L (ref 22–32)
Calcium: 9.4 mg/dL (ref 8.9–10.3)
Chloride: 101 mmol/L (ref 98–111)
Creatinine, Ser: 1.2 mg/dL — ABNORMAL HIGH (ref 0.44–1.00)
GFR calc Af Amer: 58 mL/min — ABNORMAL LOW (ref >60)
GFR calc non Af Amer: 50 mL/min — ABNORMAL LOW (ref >60)
Glucose, Bld: 273 mg/dL — ABNORMAL HIGH (ref 70–99)
Potassium: 3.3 mmol/L — ABNORMAL LOW (ref 3.5–5.1)
Sodium: 136 mmol/L (ref 135–145)
Total Bilirubin: 0.7 mg/dL (ref 0.3–1.2)
Total Protein: 7.2 g/dL (ref 6.5–8.1)

## 2020-04-30 LAB — CBC WITH DIFFERENTIAL/PLATELET
Abs Immature Granulocytes: 0.06 10*3/uL (ref 0.00–0.07)
Basophils Absolute: 0.1 10*3/uL (ref 0.0–0.1)
Basophils Relative: 1 %
Eosinophils Absolute: 0.2 10*3/uL (ref 0.0–0.5)
Eosinophils Relative: 2 %
HCT: 41.1 % (ref 36.0–46.0)
Hemoglobin: 13.5 g/dL (ref 12.0–15.0)
Immature Granulocytes: 1 %
Lymphocytes Relative: 29 %
Lymphs Abs: 2.6 10*3/uL (ref 0.7–4.0)
MCH: 31.4 pg (ref 26.0–34.0)
MCHC: 32.8 g/dL (ref 30.0–36.0)
MCV: 95.6 fL (ref 80.0–100.0)
Monocytes Absolute: 0.8 10*3/uL (ref 0.1–1.0)
Monocytes Relative: 9 %
Neutro Abs: 5.5 10*3/uL (ref 1.7–7.7)
Neutrophils Relative %: 58 %
Platelets: 397 10*3/uL (ref 150–400)
RBC: 4.3 MIL/uL (ref 3.87–5.11)
RDW: 12.9 % (ref 11.5–15.5)
WBC: 9.2 10*3/uL (ref 4.0–10.5)
nRBC: 0 % (ref 0.0–0.2)

## 2020-04-30 NOTE — Discharge Instructions (Signed)
You will need to see the Neurologist to be further evaluated for possible seizures.  Until you see Dr. Merlene Laughter, please do not drive, swim or climb ladders - if you have another seizure return to the ER immediately.
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
## 2020-05-06 ENCOUNTER — Other Ambulatory Visit: Payer: Self-pay | Admitting: Orthopedic Surgery

## 2020-05-06 DIAGNOSIS — M65311 Trigger thumb, right thumb: Secondary | ICD-10-CM | POA: Diagnosis not present

## 2020-05-06 DIAGNOSIS — G5601 Carpal tunnel syndrome, right upper limb: Secondary | ICD-10-CM | POA: Diagnosis not present

## 2020-05-12 DIAGNOSIS — G629 Polyneuropathy, unspecified: Secondary | ICD-10-CM | POA: Diagnosis not present

## 2020-05-12 DIAGNOSIS — R569 Unspecified convulsions: Secondary | ICD-10-CM | POA: Diagnosis not present

## 2020-05-12 DIAGNOSIS — E1165 Type 2 diabetes mellitus with hyperglycemia: Secondary | ICD-10-CM | POA: Diagnosis not present

## 2020-05-28 ENCOUNTER — Encounter: Payer: Self-pay | Admitting: Orthopaedic Surgery

## 2020-05-28 ENCOUNTER — Ambulatory Visit (INDEPENDENT_AMBULATORY_CARE_PROVIDER_SITE_OTHER): Payer: BC Managed Care – PPO | Admitting: Orthopaedic Surgery

## 2020-05-28 DIAGNOSIS — M25511 Pain in right shoulder: Secondary | ICD-10-CM | POA: Diagnosis not present

## 2020-05-28 DIAGNOSIS — M19019 Primary osteoarthritis, unspecified shoulder: Secondary | ICD-10-CM | POA: Insufficient documentation

## 2020-05-28 DIAGNOSIS — M19011 Primary osteoarthritis, right shoulder: Secondary | ICD-10-CM | POA: Diagnosis not present

## 2020-05-28 MED ORDER — LIDOCAINE HCL 1 % IJ SOLN
0.5000 mL | INTRAMUSCULAR | Status: AC | PRN
  Administered 2020-05-28: .5 mL

## 2020-05-28 MED ORDER — BUPIVACAINE HCL 0.5 % IJ SOLN
1.0000 mL | INTRAMUSCULAR | Status: AC | PRN
  Administered 2020-05-28: 1 mL via INTRA_ARTICULAR

## 2020-05-28 MED ORDER — METHYLPREDNISOLONE ACETATE 40 MG/ML IJ SUSP
40.0000 mg | INTRAMUSCULAR | Status: AC | PRN
  Administered 2020-05-28: 40 mg via INTRA_ARTICULAR

## 2020-05-28 MED ORDER — IBUPROFEN 800 MG PO TABS: 800.0000 mg | ORAL_TABLET | Freq: Two times a day (BID) | ORAL | 1 refills | Status: DC | PRN
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
## 2020-06-02 ENCOUNTER — Other Ambulatory Visit: Payer: Self-pay

## 2020-06-02 ENCOUNTER — Encounter (HOSPITAL_BASED_OUTPATIENT_CLINIC_OR_DEPARTMENT_OTHER): Payer: Self-pay | Admitting: Orthopedic Surgery

## 2020-06-05 ENCOUNTER — Other Ambulatory Visit (HOSPITAL_COMMUNITY)
Admission: RE | Admit: 2020-06-05 | Discharge: 2020-06-05 | Disposition: A | Discharge status: Home / Self Care | Payer: BC Managed Care – PPO | Source: Ambulatory Visit | Attending: Orthopedic Surgery | Admitting: Orthopedic Surgery

## 2020-06-05 ENCOUNTER — Encounter (HOSPITAL_BASED_OUTPATIENT_CLINIC_OR_DEPARTMENT_OTHER)
Admission: RE | Admit: 2020-06-05 | Discharge: 2020-06-05 | Disposition: A | Discharge status: Home / Self Care | Payer: BC Managed Care – PPO | Source: Ambulatory Visit | Attending: Orthopedic Surgery | Admitting: Orthopedic Surgery

## 2020-06-05 DIAGNOSIS — Z01812 Encounter for preprocedural laboratory examination: Secondary | ICD-10-CM | POA: Insufficient documentation

## 2020-06-05 DIAGNOSIS — Z20822 Contact with and (suspected) exposure to covid-19: Secondary | ICD-10-CM | POA: Insufficient documentation

## 2020-06-05 LAB — SARS CORONAVIRUS 2 (TAT 6-24 HRS): SARS Coronavirus 2: NEGATIVE

## 2020-06-05 LAB — BASIC METABOLIC PANEL
Anion gap: 11 (ref 5–15)
BUN: 13 mg/dL (ref 6–20)
CO2: 24 mmol/L (ref 22–32)
Calcium: 9.5 mg/dL (ref 8.9–10.3)
Chloride: 103 mmol/L (ref 98–111)
Creatinine, Ser: 0.86 mg/dL (ref 0.44–1.00)
GFR, Estimated: >60 mL/min (ref >60)
Glucose, Bld: 254 mg/dL — ABNORMAL HIGH (ref 70–99)
Potassium: 4.1 mmol/L (ref 3.5–5.1)
Sodium: 138 mmol/L (ref 135–145)

## 2020-06-05 NOTE — Progress Notes (Signed)

## 2020-06-08 NOTE — Progress Notes (Signed)
Glucose-254, Dr. Sabra Heck aware, will recheck day of surgery.

## 2020-06-09 ENCOUNTER — Other Ambulatory Visit: Payer: Self-pay

## 2020-06-09 ENCOUNTER — Ambulatory Visit (HOSPITAL_BASED_OUTPATIENT_CLINIC_OR_DEPARTMENT_OTHER): Payer: BC Managed Care – PPO | Admitting: Anesthesiology

## 2020-06-09 ENCOUNTER — Ambulatory Visit (HOSPITAL_BASED_OUTPATIENT_CLINIC_OR_DEPARTMENT_OTHER)
Admission: RE | Admit: 2020-06-09 | Discharge: 2020-06-09 | Disposition: A | Discharge status: Home / Self Care | Payer: BC Managed Care – PPO | Attending: Orthopedic Surgery | Admitting: Orthopedic Surgery

## 2020-06-09 ENCOUNTER — Encounter (HOSPITAL_BASED_OUTPATIENT_CLINIC_OR_DEPARTMENT_OTHER): Payer: Self-pay | Admitting: Orthopedic Surgery

## 2020-06-09 ENCOUNTER — Encounter (HOSPITAL_BASED_OUTPATIENT_CLINIC_OR_DEPARTMENT_OTHER)
Admission: RE | Disposition: A | Discharge status: Home / Self Care | Payer: Self-pay | Source: Home / Self Care | Attending: Orthopedic Surgery

## 2020-06-09 DIAGNOSIS — M47812 Spondylosis without myelopathy or radiculopathy, cervical region: Secondary | ICD-10-CM | POA: Diagnosis not present

## 2020-06-09 DIAGNOSIS — G5601 Carpal tunnel syndrome, right upper limb: Secondary | ICD-10-CM | POA: Insufficient documentation

## 2020-06-09 DIAGNOSIS — Z794 Long term (current) use of insulin: Secondary | ICD-10-CM | POA: Diagnosis not present

## 2020-06-09 DIAGNOSIS — Z880 Allergy status to penicillin: Secondary | ICD-10-CM | POA: Insufficient documentation

## 2020-06-09 DIAGNOSIS — Z881 Allergy status to other antibiotic agents status: Secondary | ICD-10-CM | POA: Diagnosis not present

## 2020-06-09 DIAGNOSIS — Z885 Allergy status to narcotic agent status: Secondary | ICD-10-CM | POA: Diagnosis not present

## 2020-06-09 DIAGNOSIS — F17200 Nicotine dependence, unspecified, uncomplicated: Secondary | ICD-10-CM | POA: Diagnosis not present

## 2020-06-09 DIAGNOSIS — E119 Type 2 diabetes mellitus without complications: Secondary | ICD-10-CM | POA: Diagnosis not present

## 2020-06-09 DIAGNOSIS — Z886 Allergy status to analgesic agent status: Secondary | ICD-10-CM | POA: Insufficient documentation

## 2020-06-09 DIAGNOSIS — M65311 Trigger thumb, right thumb: Secondary | ICD-10-CM | POA: Insufficient documentation

## 2020-06-09 DIAGNOSIS — M65841 Other synovitis and tenosynovitis, right hand: Secondary | ICD-10-CM | POA: Diagnosis not present

## 2020-06-09 HISTORY — PX: CARPAL TUNNEL RELEASE: SHX101

## 2020-06-09 HISTORY — DX: Nicotine dependence, unspecified, uncomplicated: F17.200

## 2020-06-09 LAB — GLUCOSE, CAPILLARY
Glucose-Capillary: 150 mg/dL — ABNORMAL HIGH (ref 70–99)
Glucose-Capillary: 70 mg/dL (ref 70–99)
Glucose-Capillary: 93 mg/dL (ref 70–99)

## 2020-06-09 SURGERY — CARPAL TUNNEL RELEASE
Anesthesia: Regional | Site: Hand | Laterality: Right

## 2020-06-09 MED ORDER — CLINDAMYCIN PHOSPHATE 900 MG/50ML IV SOLN
INTRAVENOUS | Status: AC
  Filled 2020-06-09: qty 50

## 2020-06-09 MED ORDER — SUCCINYLCHOLINE CHLORIDE 200 MG/10ML IV SOSY
PREFILLED_SYRINGE | INTRAVENOUS | Status: AC
  Filled 2020-06-09: qty 10

## 2020-06-09 MED ORDER — ONDANSETRON HCL 4 MG/2ML IJ SOLN
INTRAMUSCULAR | Status: AC
  Filled 2020-06-09: qty 2

## 2020-06-09 MED ORDER — MIDAZOLAM HCL 2 MG/2ML IJ SOLN
INTRAMUSCULAR | Status: AC
  Filled 2020-06-09: qty 2

## 2020-06-09 MED ORDER — FENTANYL CITRATE (PF) 100 MCG/2ML IJ SOLN
INTRAMUSCULAR | Status: AC
  Filled 2020-06-09: qty 2

## 2020-06-09 MED ORDER — PHENYLEPHRINE 40 MCG/ML (10ML) SYRINGE FOR IV PUSH (FOR BLOOD PRESSURE SUPPORT)
PREFILLED_SYRINGE | INTRAVENOUS | Status: AC
  Filled 2020-06-09: qty 10

## 2020-06-09 MED ORDER — PROPOFOL 500 MG/50ML IV EMUL
INTRAVENOUS | Status: AC
  Filled 2020-06-09: qty 50

## 2020-06-09 MED ORDER — FENTANYL CITRATE (PF) 100 MCG/2ML IJ SOLN
25.0000 ug | INTRAMUSCULAR | Status: DC | PRN
  Administered 2020-06-09 (×2): 25 ug via INTRAVENOUS

## 2020-06-09 MED ORDER — DEXTROSE 50 % IV SOLN
INTRAVENOUS | Status: AC
  Filled 2020-06-09: qty 50

## 2020-06-09 MED ORDER — LACTATED RINGERS IV SOLN: INTRAVENOUS | Status: DC

## 2020-06-09 MED ORDER — DEXTROSE 50 % IV SOLN
25.0000 mL | Freq: Once | INTRAVENOUS | Status: AC
  Administered 2020-06-09: 25 mL via INTRAVENOUS

## 2020-06-09 MED ORDER — LIDOCAINE 2% (20 MG/ML) 5 ML SYRINGE
INTRAMUSCULAR | Status: AC
  Filled 2020-06-09: qty 5

## 2020-06-09 MED ORDER — PROPOFOL 10 MG/ML IV BOLUS
INTRAVENOUS | Status: DC | PRN
  Administered 2020-06-09: 40 mg via INTRAVENOUS

## 2020-06-09 MED ORDER — TRAMADOL HCL 50 MG PO TABS
50.0000 mg | ORAL_TABLET | Freq: Four times a day (QID) | ORAL | 0 refills | Status: DC | PRN
Start: 2020-06-09 — End: 2021-07-01

## 2020-06-09 MED ORDER — EPHEDRINE 5 MG/ML INJ
INTRAVENOUS | Status: AC
  Filled 2020-06-09: qty 10

## 2020-06-09 MED ORDER — FENTANYL CITRATE (PF) 100 MCG/2ML IJ SOLN
50.0000 ug | Freq: Once | INTRAMUSCULAR | Status: AC
  Administered 2020-06-09: 50 ug via INTRAVENOUS

## 2020-06-09 MED ORDER — PROPOFOL 500 MG/50ML IV EMUL
INTRAVENOUS | Status: DC | PRN
  Administered 2020-06-09: 100 ug/kg/min via INTRAVENOUS

## 2020-06-09 MED ORDER — CLINDAMYCIN PHOSPHATE 900 MG/50ML IV SOLN
900.0000 mg | INTRAVENOUS | Status: AC
  Administered 2020-06-09: 900 mg via INTRAVENOUS

## 2020-06-09 MED ORDER — BUPIVACAINE HCL (PF) 0.25 % IJ SOLN
INTRAMUSCULAR | Status: DC | PRN
  Administered 2020-06-09: 10 mL

## 2020-06-09 MED ORDER — BUPIVACAINE-EPINEPHRINE (PF) 0.5% -1:200000 IJ SOLN
INTRAMUSCULAR | Status: DC | PRN
  Administered 2020-06-09: 30 mL via PERINEURAL

## 2020-06-09 MED ORDER — ONDANSETRON HCL 4 MG/2ML IJ SOLN
INTRAMUSCULAR | Status: DC | PRN
  Administered 2020-06-09: 4 mg via INTRAVENOUS

## 2020-06-09 MED ORDER — PROMETHAZINE HCL 25 MG/ML IJ SOLN: 6.2500 mg | INTRAMUSCULAR | Status: DC | PRN

## 2020-06-09 MED ORDER — MIDAZOLAM HCL 5 MG/5ML IJ SOLN
INTRAMUSCULAR | Status: DC | PRN
  Administered 2020-06-09: 1 mg via INTRAVENOUS

## 2020-06-09 MED ORDER — MIDAZOLAM HCL 2 MG/2ML IJ SOLN
2.0000 mg | Freq: Once | INTRAMUSCULAR | Status: AC
  Administered 2020-06-09: 2 mg via INTRAVENOUS

## 2020-06-09 SURGICAL SUPPLY — 36 items
APL PRP STRL LF DISP 70% ISPRP (MISCELLANEOUS) ×1
BLADE SURG 15 STRL LF DISP TIS (BLADE) ×1 IMPLANT
BLADE SURG 15 STRL SS (BLADE) ×3
BNDG CMPR 9X4 STRL LF SNTH (GAUZE/BANDAGES/DRESSINGS)
BNDG COHESIVE 3X5 TAN STRL LF (GAUZE/BANDAGES/DRESSINGS) ×3 IMPLANT
BNDG ESMARK 4X9 LF (GAUZE/BANDAGES/DRESSINGS) IMPLANT
BNDG GAUZE ELAST 4 BULKY (GAUZE/BANDAGES/DRESSINGS) ×3 IMPLANT
CHLORAPREP W/TINT 26 (MISCELLANEOUS) ×3 IMPLANT
CORD BIPOLAR FORCEPS 12FT (ELECTRODE) ×3 IMPLANT
COVER BACK TABLE 60X90IN (DRAPES) ×3 IMPLANT
COVER MAYO STAND STRL (DRAPES) ×3 IMPLANT
COVER WAND RF STERILE (DRAPES) IMPLANT
CUFF TOURN SGL QUICK 18X4 (TOURNIQUET CUFF) ×3 IMPLANT
DRAPE EXTREMITY T 121X128X90 (DISPOSABLE) ×3 IMPLANT
DRAPE SURG 17X23 STRL (DRAPES) ×3 IMPLANT
DRSG PAD ABDOMINAL 8X10 ST (GAUZE/BANDAGES/DRESSINGS) ×3 IMPLANT
GAUZE SPONGE 4X4 12PLY STRL (GAUZE/BANDAGES/DRESSINGS) ×3 IMPLANT
GAUZE XEROFORM 1X8 LF (GAUZE/BANDAGES/DRESSINGS) ×3 IMPLANT
GLOVE BIOGEL PI IND STRL 8.5 (GLOVE) ×1 IMPLANT
GLOVE BIOGEL PI INDICATOR 8.5 (GLOVE) ×2
GLOVE SURG ORTHO 8.0 STRL STRW (GLOVE) ×3 IMPLANT
GOWN STRL REUS W/ TWL LRG LVL3 (GOWN DISPOSABLE) ×1 IMPLANT
GOWN STRL REUS W/TWL LRG LVL3 (GOWN DISPOSABLE) ×3
GOWN STRL REUS W/TWL XL LVL3 (GOWN DISPOSABLE) ×3 IMPLANT
NDL PRECISIONGLIDE 27X1.5 (NEEDLE) IMPLANT
NEEDLE PRECISIONGLIDE 27X1.5 (NEEDLE) ×3 IMPLANT
NS IRRIG 1000ML POUR BTL (IV SOLUTION) ×3 IMPLANT
PACK BASIN DAY SURGERY FS (CUSTOM PROCEDURE TRAY) ×3 IMPLANT
SLING ARM FOAM STRAP LRG (SOFTGOODS) ×2 IMPLANT
STOCKINETTE 4X48 STRL (DRAPES) ×3 IMPLANT
SUT ETHILON 4 0 PS 2 18 (SUTURE) ×3 IMPLANT
SUT VICRYL 4-0 PS2 18IN ABS (SUTURE) IMPLANT
SYR BULB EAR ULCER 3OZ GRN STR (SYRINGE) ×3 IMPLANT
SYR CONTROL 10ML LL (SYRINGE) ×2 IMPLANT
TOWEL GREEN STERILE FF (TOWEL DISPOSABLE) ×3 IMPLANT
UNDERPAD 30X36 HEAVY ABSORB (UNDERPADS AND DIAPERS) ×3 IMPLANT
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
## 2020-06-09 NOTE — Anesthesia Procedure Notes (Signed)
Anesthesia Regional Block: Axillary brachial plexus block   Pre-Anesthetic Checklist: ,, timeout performed, Correct Patient, Correct Site, Correct Laterality, Correct Procedure, Correct Position, site marked, Risks and benefits discussed,  Surgical consent,  Pre-op evaluation,  At surgeon's request and post-op pain management  Laterality: Right  Prep: chloraprep       Needles:  Injection technique: Single-shot  Needle Type: Echogenic Needle     Needle Length: 5cm  Needle Gauge: 21     Additional Needles:   Narrative:  Start time: 06/09/2020 8:51 AM End time: 06/09/2020 8:56 AM Injection made incrementally with aspirations every 5 mL.  Performed by: Personally  Anesthesiologist: Audry Pili, MD  Additional Notes: No pain on injection. No increased resistance to injection. Injection made in 5cc increments. Good needle visualization. Patient tolerated the procedure well.

## 2020-06-09 NOTE — Transfer of Care (Signed)
Immediate Anesthesia Transfer of Care Note  Patient: Madeline Simpson  Procedure(s) Performed: RIGHT CARPAL TUNNEL RELEASE AND RELEASE TRIGGER FINGER/A-1 PULLEY EXTENDER (Right Hand)  Patient Location: PACU  Anesthesia Type:MAC and MAC combined with regional for post-op pain  Level of Consciousness: awake, alert  and oriented  Airway & Oxygen Therapy: Patient Spontanous Breathing and Patient connected to face mask oxygen  Post-op Assessment: Report given to RN and Post -op Vital signs reviewed and stable  Post vital signs: Reviewed and stable  Last Vitals:  Vitals Value Taken Time  BP 132/67 06/09/20 1030  Temp    Pulse 88 06/09/20 1029  Resp 17 06/09/20 1029  SpO2 94 % 06/09/20 1029  Vitals shown include unvalidated device data.  Last Pain:  Vitals:   06/09/20 0832  TempSrc: Oral  PainSc: 9       Patients Stated Pain Goal: 5 (06/30/51 0802)  Complications: No complications documented.

## 2020-06-09 NOTE — Brief Op Note (Signed)
06/09/2020  10:28 AM  PATIENT:  Madeline Simpson  59 y.o. female  PRE-OPERATIVE DIAGNOSIS:  STENOSING TENOSYNOSIS RIGHT THUMB RIGHT  CARPAL TUNNEL SYNDROME  POST-OPERATIVE DIAGNOSIS:  STENOSING TENOSYNOSIS RIGHT THUMB RIGHT  CARPAL   PROCEDURE:  Procedure(s) with comments: RIGHT CARPAL TUNNEL RELEASE AND RELEASE TRIGGER FINGER/A-1 PULLEY EXTENDER (Right) - A1 BLOCK  SURGEON:  Surgeon(s) and Role:    * Daryll Brod, MD - Primary  PHYSICIAN ASSISTANT:   ASSISTANTS: none   ANESTHESIA:   local, regional and IV sedation  EBL:  5 mL   BLOOD ADMINISTERED:none  DRAINS: none   LOCAL MEDICATIONS USED:  BUPIVICAINE   SPECIMEN:  No Specimen  DISPOSITION OF SPECIMEN:  N/A  COUNTS:  YES  TOURNIQUET:   Total Tourniquet Time Documented: Upper Arm (Right) - 42 minutes Total: Upper Arm (Right) - 42 minutes   DICTATION: .Viviann Spare Dictation  PLAN OF CARE: Discharge to home after PACU  PATIENT DISPOSITION:  PACU - hemodynamically stable.   D

## 2020-06-09 NOTE — Discharge Instructions (Signed)
Post Anesthesia Home Care Instructions  Activity: Get plenty of rest for the remainder of the day. A responsible individual must stay with you for 24 hours following the procedure.  For the next 24 hours, DO NOT: -Drive a car -Operate machinery -Drink alcoholic beverages -Take any medication unless instructed by your physician -Make any legal decisions or sign important papers.  Meals: Start with liquid foods such as gelatin or soup. Progress to regular foods as tolerated. Avoid greasy, spicy, heavy foods. If nausea and/or vomiting occur, drink only clear liquids until the nausea and/or vomiting subsides. Call your physician if vomiting continues.  Special Instructions/Symptoms: Your throat may feel dry or sore from the anesthesia or the breathing tube placed in your throat during surgery. If this causes discomfort, gargle with warm salt water. The discomfort should disappear within 24 hours.  If you had a scopolamine patch placed behind your ear for the management of post- operative nausea and/or vomiting:  1. The medication in the patch is effective for 72 hours, after which it should be removed.  Wrap patch in a tissue and discard in the trash. Wash hands thoroughly with soap and water. 2. You may remove the patch earlier than 72 hours if you experience unpleasant side effects which may include dry mouth, dizziness or visual disturbances. 3. Avoid touching the patch. Wash your hands with soap and water after contact with the patch.      Regional Anesthesia Blocks  1. Numbness or the inability to move the "blocked" extremity may last from 3-48 hours after placement. The length of time depends on the medication injected and your individual response to the medication. If the numbness is not going away after 48 hours, call your surgeon.  2. The extremity that is blocked will need to be protected until the numbness is gone and the  Strength has returned. Because you cannot feel it, you  will need to take extra care to avoid injury. Because it may be weak, you may have difficulty moving it or using it. You may not know what position it is in without looking at it while the block is in effect.  3. For blocks in the legs and feet, returning to weight bearing and walking needs to be done carefully. You will need to wait until the numbness is entirely gone and the strength has returned. You should be able to move your leg and foot normally before you try and bear weight or walk. You will need someone to be with you when you first try to ensure you do not fall and possibly risk injury.  4. Bruising and tenderness at the needle site are common side effects and will resolve in a few days.  5. Persistent numbness or new problems with movement should be communicated to the surgeon or the [facility] ([phone])/ [facility] ([phone]).    Hand Center Instructions Hand Surgery  Wound Care: Keep your hand elevated above the level of your heart.  Do not allow it to dangle by your side.  Keep the dressing dry and do not remove it unless your doctor advises you to do so.  He will usually change it at the time of your post-op visit.  Moving your fingers is advised to stimulate circulation but will depend on the site of your surgery.  If you have a splint applied, your doctor will advise you regarding movement.  Activity: Do not drive or operate machinery today.  Rest today and   then you may return to your normal activity and work as indicated by your physician.  Diet:  Drink liquids today or eat a light diet.  You may resume a regular diet tomorrow.    General expectations: Pain for two to three days. Fingers may become slightly swollen.  Call your doctor if any of the following occur: Severe pain not relieved by pain medication. Elevated temperature. Dressing soaked with blood. Inability to move fingers. White or bluish color to fingers.  

## 2020-06-09 NOTE — Anesthesia Postprocedure Evaluation (Signed)
Anesthesia Post Note  Patient: Madeline Simpson  Procedure(s) Performed: RIGHT CARPAL TUNNEL RELEASE AND RELEASE TRIGGER FINGER/A-1 PULLEY EXTENDER (Right Hand)     Patient location during evaluation: PACU Anesthesia Type: Regional Level of consciousness: awake and alert Pain management: pain level controlled Vital Signs Assessment: post-procedure vital signs reviewed and stable Respiratory status: spontaneous breathing, nonlabored ventilation and respiratory function stable Cardiovascular status: stable and blood pressure returned to baseline Anesthetic complications: no   No complications documented.  Last Vitals:  Vitals:   06/09/20 1100 06/09/20 1110  BP: 120/76   Pulse: 85 82  Resp: 15 18  Temp:    SpO2: 99% 98%                  Audry Pili

## 2020-06-09 NOTE — Op Note (Signed)
NAME: Madeline Simpson MEDICAL RECORD NO: [id number] DATE OF BIRTH: 01-09-1961 FACILITY: [facility] LOCATION: [facility] PHYSICIAN: Wynonia Sours, MD   OPERATIVE REPORT   DATE OF PROCEDURE: 06/09/20    PREOPERATIVE DIAGNOSIS:   Carpal tunnel syndrome right hand and stenosing tenosynovitis right thumb   POSTOPERATIVE DIAGNOSIS:   Same   PROCEDURE:   Release A1 pulley right thumb with exploration decompression neurolysis median nerve right hand and forearm   SURGEON: Daryll Brod, M.D.   ASSISTANT: none   ANESTHESIA:  Regional with sedation and Local   INTRAVENOUS FLUIDS:  Per anesthesia flow sheet.   ESTIMATED BLOOD LOSS:  Minimal.   COMPLICATIONS:  None.   SPECIMENS:  none   TOURNIQUET TIME:    Total Tourniquet Time Documented: Upper Arm (Right) - 42 minutes Total: Upper Arm (Right) - 42 minutes    DISPOSITION:  Stable to PACU.   INDICATIONS: Patient is a 59 year old female with a history of numbness and tingling of her right hand.  She is also complaining of catching of her right thumb.  Neither has responded to conservative treatment.  She has an old injury directly over the median nerve approximately 3 cm proximal to the volar wrist crease.  Nerve conductions are positive revealing carpal tunnel syndrome with malfunction of the median nerve.  We have discussed exploration decompression.  Preperi-and postoperative course been discussed along with risks and complications.  She is aware that there is no guarantee to the surgery the possibility of infection recurrence injury to arteries nerves tendons incomplete relief of symptoms and dystrophy.  In the preoperative area the patient is seen the extremity marked by both patient and surgeon antibiotic given  OPERATIVE COURSE: A supraclavicular block was carried out without difficulty under the direction of the anesthesia department in the preoperative area.  Patient was placed in the supine position prepped and  draped.  Prep was done with ChloraPrep a 3-minute dry time was allowed and a timeout taken confirming patient procedure.  The limb was exsanguinated with an Esmarch bandage turn placed high in the arm was inflated to 250 mmHg.  The thumb was attended to first.  An transverse incision was made just at the level of the metacarpal phalangeal joint crease to the thumb carried down through subcutaneous tissue.  Bleeders were electrocauterized with bipolar.  Neurovascular structures were identified protected the A1 pulley was found to be markedly long approximately 3 to 4 cm in length.  This was released on its radial aspect taking care to protect the digital nerves radially and ulnarly.  The oblique pulley was left intact.  After release of the A1 pulley the thumb was placed through full full passive range of motion no further triggering was noted.  The wound was copiously irrigated with saline.  The skin was closed interrupted 4-0 nylon sutures  The carpal tunnel was attended to next.  A straight incision was made longitudinally in the palm carried to the ulnar side of the wrist and then proximally.  The scar was immediately encountered proximally.  With blunt sharp dissection this was freed from the underlying nerve was markedly adherent to the underlying nerve with scar around the nerve.  Normal nerve was identified proximally.  This was done traced distally through the area of scar.  The dissection was dissection was then carried distally.  The median nerve was found to be extremely hyperemic over its entire course distal to the area of scarring.  This was released on  its ulnar border taking care to protect the ulnar nerve.  This was done to the level of the transverse superficial arch.  Motor branch entered in the muscle distally.  No further lesions were identified distally.  The scarring was then freed from the nerve proximally.  An epineural lysis was then performed revealing no injury to the nerve beneath.   The epineural scar was removed.  The wound was copiously irrigated with saline.  The skin was then closed erupted 4 nylon sutures.  Local infiltration was then given with quarter percent bupivacaine without epinephrine 10 cc was used both wounds.  A sterile compressive dressing and volar splint was applied.  Deflation of the tourniquet all fingers immediately pink.  She was taken to the recovery room for observation in satisfactory condition.  She will be discharged home to return to the hand center of [facility] in 1 week on Tylenol ibuprofen for pain with Ultram for breakthrough.   Daryll Brod, MD Electronically signed, 06/09/20

## 2020-06-09 NOTE — Anesthesia Preprocedure Evaluation (Addendum)
Anesthesia Evaluation  Patient identified by MRN, date of birth, ID band Patient awake    Reviewed: Allergy & Precautions, NPO status , Patient's Chart, lab work & pertinent test results  History of Anesthesia Complications (+) Emergence Delirium and history of anesthetic complications  Airway Mallampati: I  TM Distance: >3 FB Neck ROM: Limited    Dental  (+) Dental Advisory Given   Pulmonary Current Smoker and Patient abstained from smoking.,    Pulmonary exam normal        Cardiovascular hypertension, Pt. on medications Normal cardiovascular exam     Neuro/Psych Seizures -, Well Controlled,  negative psych ROS   GI/Hepatic GERD  Controlled,(+)     substance abuse  marijuana use,  IBS    Endo/Other  diabetes, Type 2, Insulin Dependent Obesity   Renal/GU negative Renal ROS     Musculoskeletal  (+) Arthritis ,   Abdominal   Peds  Hematology negative hematology ROS (+)   Anesthesia Other Findings Covid test negative   Reproductive/Obstetrics  S/p hysterectomy                            Anesthesia Physical Anesthesia Plan  ASA: II  Anesthesia Plan: Regional   Post-op Pain Management:    Induction: Intravenous  PONV Risk Score and Plan: 1 and Propofol infusion and Treatment may vary due to age or medical condition  Airway Management Planned: Natural Airway and Simple Face Mask  Additional Equipment: None  Intra-op Plan:   Post-operative Plan:   Informed Consent: I have reviewed the patients History and Physical, chart, labs and discussed the procedure including the risks, benefits and alternatives for the proposed anesthesia with the patient or authorized representative who has indicated his/her understanding and acceptance.       Plan Discussed with: CRNA, Anesthesiologist and Surgeon  Anesthesia Plan Comments:      Anesthesia Quick Evaluation

## 2020-06-09 NOTE — Progress Notes (Signed)
Assisted Dr. Fransisco Beau with right, ultrasound guided, axillary block. Side rails up, monitors on throughout procedure. See vital signs in flow sheet. Tolerated Procedure well.

## 2020-06-10 ENCOUNTER — Encounter (HOSPITAL_BASED_OUTPATIENT_CLINIC_OR_DEPARTMENT_OTHER): Payer: Self-pay | Admitting: Orthopedic Surgery

## 2020-06-24 DIAGNOSIS — E1140 Type 2 diabetes mellitus with diabetic neuropathy, unspecified: Secondary | ICD-10-CM | POA: Diagnosis not present

## 2020-06-29 DIAGNOSIS — T8130XD Disruption of wound, unspecified, subsequent encounter: Secondary | ICD-10-CM | POA: Diagnosis not present

## 2020-06-29 DIAGNOSIS — M65311 Trigger thumb, right thumb: Secondary | ICD-10-CM | POA: Diagnosis not present

## 2020-06-29 DIAGNOSIS — G5603 Carpal tunnel syndrome, bilateral upper limbs: Secondary | ICD-10-CM | POA: Diagnosis not present

## 2020-06-29 DIAGNOSIS — Z23 Encounter for immunization: Secondary | ICD-10-CM | POA: Diagnosis not present

## 2020-06-29 DIAGNOSIS — E1165 Type 2 diabetes mellitus with hyperglycemia: Secondary | ICD-10-CM | POA: Diagnosis not present

## 2020-06-29 DIAGNOSIS — E11319 Type 2 diabetes mellitus with unspecified diabetic retinopathy without macular edema: Secondary | ICD-10-CM | POA: Diagnosis not present

## 2020-06-29 DIAGNOSIS — G5601 Carpal tunnel syndrome, right upper limb: Secondary | ICD-10-CM | POA: Diagnosis not present

## 2020-06-29 DIAGNOSIS — Z794 Long term (current) use of insulin: Secondary | ICD-10-CM | POA: Diagnosis not present

## 2020-06-29 DIAGNOSIS — E1140 Type 2 diabetes mellitus with diabetic neuropathy, unspecified: Secondary | ICD-10-CM | POA: Diagnosis not present

## 2020-07-02 DIAGNOSIS — G5601 Carpal tunnel syndrome, right upper limb: Secondary | ICD-10-CM | POA: Diagnosis not present

## 2020-07-02 DIAGNOSIS — M65311 Trigger thumb, right thumb: Secondary | ICD-10-CM | POA: Diagnosis not present

## 2020-07-02 DIAGNOSIS — G5603 Carpal tunnel syndrome, bilateral upper limbs: Secondary | ICD-10-CM | POA: Diagnosis not present

## 2020-07-02 DIAGNOSIS — T8130XD Disruption of wound, unspecified, subsequent encounter: Secondary | ICD-10-CM | POA: Diagnosis not present

## 2020-07-07 ENCOUNTER — Other Ambulatory Visit: Payer: Self-pay | Admitting: Orthopaedic Surgery

## 2020-07-07 NOTE — Telephone Encounter (Signed)
Please advise 

## 2020-07-30 DIAGNOSIS — M180 Bilateral primary osteoarthritis of first carpometacarpal joints: Secondary | ICD-10-CM | POA: Diagnosis not present

## 2020-07-30 DIAGNOSIS — M65311 Trigger thumb, right thumb: Secondary | ICD-10-CM | POA: Diagnosis not present

## 2020-07-30 DIAGNOSIS — M25531 Pain in right wrist: Secondary | ICD-10-CM | POA: Diagnosis not present

## 2020-07-30 DIAGNOSIS — G5601 Carpal tunnel syndrome, right upper limb: Secondary | ICD-10-CM | POA: Diagnosis not present

## 2020-08-05 ENCOUNTER — Other Ambulatory Visit: Payer: Self-pay | Admitting: Orthopaedic Surgery

## 2020-08-05 NOTE — Telephone Encounter (Signed)
Please advise 

## 2020-08-21 DIAGNOSIS — G5603 Carpal tunnel syndrome, bilateral upper limbs: Secondary | ICD-10-CM | POA: Diagnosis not present

## 2020-08-21 DIAGNOSIS — G5601 Carpal tunnel syndrome, right upper limb: Secondary | ICD-10-CM | POA: Diagnosis not present

## 2020-08-21 DIAGNOSIS — R200 Anesthesia of skin: Secondary | ICD-10-CM | POA: Diagnosis not present

## 2020-08-21 DIAGNOSIS — M25531 Pain in right wrist: Secondary | ICD-10-CM | POA: Diagnosis not present

## 2020-09-04 DIAGNOSIS — M5412 Radiculopathy, cervical region: Secondary | ICD-10-CM | POA: Diagnosis not present

## 2020-09-04 DIAGNOSIS — G8929 Other chronic pain: Secondary | ICD-10-CM | POA: Diagnosis not present

## 2020-09-04 DIAGNOSIS — M25511 Pain in right shoulder: Secondary | ICD-10-CM | POA: Diagnosis not present

## 2020-09-08 ENCOUNTER — Other Ambulatory Visit: Payer: BC Managed Care – PPO

## 2020-09-08 ENCOUNTER — Other Ambulatory Visit: Payer: Self-pay

## 2020-09-08 DIAGNOSIS — Z20822 Contact with and (suspected) exposure to covid-19: Secondary | ICD-10-CM

## 2020-09-08 DIAGNOSIS — G5603 Carpal tunnel syndrome, bilateral upper limbs: Secondary | ICD-10-CM | POA: Diagnosis not present

## 2020-09-08 DIAGNOSIS — G5601 Carpal tunnel syndrome, right upper limb: Secondary | ICD-10-CM | POA: Diagnosis not present

## 2020-09-08 DIAGNOSIS — R200 Anesthesia of skin: Secondary | ICD-10-CM | POA: Diagnosis not present

## 2020-09-08 DIAGNOSIS — M25531 Pain in right wrist: Secondary | ICD-10-CM | POA: Diagnosis not present

## 2020-09-09 LAB — NOVEL CORONAVIRUS, NAA: SARS-CoV-2, NAA: DETECTED — AB

## 2020-09-09 LAB — SARS-COV-2, NAA 2 DAY TAT

## 2020-09-15 DIAGNOSIS — E1140 Type 2 diabetes mellitus with diabetic neuropathy, unspecified: Secondary | ICD-10-CM | POA: Diagnosis not present

## 2020-09-15 DIAGNOSIS — E1165 Type 2 diabetes mellitus with hyperglycemia: Secondary | ICD-10-CM | POA: Diagnosis not present

## 2020-09-15 DIAGNOSIS — Z794 Long term (current) use of insulin: Secondary | ICD-10-CM | POA: Diagnosis not present

## 2020-09-15 DIAGNOSIS — I10 Essential (primary) hypertension: Secondary | ICD-10-CM | POA: Diagnosis not present

## 2020-09-21 DIAGNOSIS — M5412 Radiculopathy, cervical region: Secondary | ICD-10-CM | POA: Diagnosis not present

## 2020-09-23 DIAGNOSIS — G5601 Carpal tunnel syndrome, right upper limb: Secondary | ICD-10-CM | POA: Diagnosis not present

## 2020-09-23 DIAGNOSIS — G5603 Carpal tunnel syndrome, bilateral upper limbs: Secondary | ICD-10-CM | POA: Diagnosis not present

## 2020-09-23 DIAGNOSIS — M65311 Trigger thumb, right thumb: Secondary | ICD-10-CM | POA: Diagnosis not present

## 2020-09-23 DIAGNOSIS — R200 Anesthesia of skin: Secondary | ICD-10-CM | POA: Diagnosis not present

## 2020-11-26 ENCOUNTER — Other Ambulatory Visit: Payer: Self-pay | Admitting: Orthopaedic Surgery

## 2020-11-26 DIAGNOSIS — I10 Essential (primary) hypertension: Secondary | ICD-10-CM | POA: Diagnosis not present

## 2020-11-26 DIAGNOSIS — R809 Proteinuria, unspecified: Secondary | ICD-10-CM | POA: Diagnosis not present

## 2020-11-26 DIAGNOSIS — E1140 Type 2 diabetes mellitus with diabetic neuropathy, unspecified: Secondary | ICD-10-CM | POA: Diagnosis not present

## 2020-12-16 ENCOUNTER — Other Ambulatory Visit: Payer: Self-pay | Admitting: Orthopedic Surgery

## 2020-12-16 DIAGNOSIS — G5601 Carpal tunnel syndrome, right upper limb: Secondary | ICD-10-CM | POA: Diagnosis not present

## 2020-12-28 DIAGNOSIS — E1165 Type 2 diabetes mellitus with hyperglycemia: Secondary | ICD-10-CM | POA: Diagnosis not present

## 2020-12-28 DIAGNOSIS — Z794 Long term (current) use of insulin: Secondary | ICD-10-CM | POA: Diagnosis not present

## 2020-12-28 DIAGNOSIS — R809 Proteinuria, unspecified: Secondary | ICD-10-CM | POA: Diagnosis not present

## 2020-12-28 DIAGNOSIS — E1140 Type 2 diabetes mellitus with diabetic neuropathy, unspecified: Secondary | ICD-10-CM | POA: Diagnosis not present

## 2021-01-19 ENCOUNTER — Ambulatory Visit (HOSPITAL_BASED_OUTPATIENT_CLINIC_OR_DEPARTMENT_OTHER): Admit: 2021-01-19 | Payer: BC Managed Care – PPO | Admitting: Orthopedic Surgery

## 2021-01-19 ENCOUNTER — Encounter (HOSPITAL_BASED_OUTPATIENT_CLINIC_OR_DEPARTMENT_OTHER): Payer: Self-pay

## 2021-01-19 SURGERY — CARPAL TUNNEL RELEASE
Anesthesia: Regional | Laterality: Left

## 2021-02-05 DIAGNOSIS — E1121 Type 2 diabetes mellitus with diabetic nephropathy: Secondary | ICD-10-CM | POA: Diagnosis not present

## 2021-02-05 DIAGNOSIS — I10 Essential (primary) hypertension: Secondary | ICD-10-CM | POA: Diagnosis not present

## 2021-02-05 DIAGNOSIS — E7800 Pure hypercholesterolemia, unspecified: Secondary | ICD-10-CM | POA: Diagnosis not present

## 2021-02-05 DIAGNOSIS — F331 Major depressive disorder, recurrent, moderate: Secondary | ICD-10-CM | POA: Diagnosis not present

## 2021-02-08 ENCOUNTER — Other Ambulatory Visit: Payer: Self-pay | Admitting: Family Medicine

## 2021-02-08 DIAGNOSIS — N644 Mastodynia: Secondary | ICD-10-CM

## 2021-03-01 ENCOUNTER — Other Ambulatory Visit: Payer: BC Managed Care – PPO

## 2021-03-18 ENCOUNTER — Other Ambulatory Visit: Payer: Self-pay

## 2021-03-18 ENCOUNTER — Ambulatory Visit
Admission: RE | Admit: 2021-03-18 | Discharge: 2021-03-18 | Disposition: A | Discharge status: Home / Self Care | Payer: BC Managed Care – PPO | Source: Ambulatory Visit | Attending: Family Medicine | Admitting: Family Medicine

## 2021-03-18 ENCOUNTER — Other Ambulatory Visit: Payer: Self-pay | Admitting: Family Medicine

## 2021-03-18 DIAGNOSIS — N644 Mastodynia: Secondary | ICD-10-CM

## 2021-03-18 DIAGNOSIS — R922 Inconclusive mammogram: Secondary | ICD-10-CM | POA: Diagnosis not present

## 2021-06-14 ENCOUNTER — Other Ambulatory Visit: Payer: Self-pay | Admitting: Orthopedic Surgery

## 2021-06-15 ENCOUNTER — Emergency Department (HOSPITAL_COMMUNITY)
Admission: EM | Admit: 2021-06-15 | Discharge: 2021-06-16 | Disposition: A | Discharge status: Home / Self Care | Payer: BC Managed Care – PPO | Attending: Emergency Medicine | Admitting: Emergency Medicine

## 2021-06-15 ENCOUNTER — Emergency Department (HOSPITAL_COMMUNITY): Payer: BC Managed Care – PPO

## 2021-06-15 ENCOUNTER — Encounter (HOSPITAL_COMMUNITY): Payer: Self-pay | Admitting: *Deleted

## 2021-06-15 ENCOUNTER — Other Ambulatory Visit: Payer: Self-pay

## 2021-06-15 DIAGNOSIS — R0789 Other chest pain: Secondary | ICD-10-CM | POA: Diagnosis not present

## 2021-06-15 DIAGNOSIS — F17210 Nicotine dependence, cigarettes, uncomplicated: Secondary | ICD-10-CM | POA: Insufficient documentation

## 2021-06-15 DIAGNOSIS — Z794 Long term (current) use of insulin: Secondary | ICD-10-CM | POA: Insufficient documentation

## 2021-06-15 DIAGNOSIS — R059 Cough, unspecified: Secondary | ICD-10-CM | POA: Diagnosis not present

## 2021-06-15 DIAGNOSIS — R0602 Shortness of breath: Secondary | ICD-10-CM | POA: Diagnosis not present

## 2021-06-15 DIAGNOSIS — R0981 Nasal congestion: Secondary | ICD-10-CM | POA: Diagnosis not present

## 2021-06-15 DIAGNOSIS — Z79899 Other long term (current) drug therapy: Secondary | ICD-10-CM | POA: Insufficient documentation

## 2021-06-15 DIAGNOSIS — I10 Essential (primary) hypertension: Secondary | ICD-10-CM | POA: Diagnosis not present

## 2021-06-15 DIAGNOSIS — R509 Fever, unspecified: Secondary | ICD-10-CM | POA: Diagnosis not present

## 2021-06-15 DIAGNOSIS — Z20822 Contact with and (suspected) exposure to covid-19: Secondary | ICD-10-CM | POA: Diagnosis not present

## 2021-06-15 DIAGNOSIS — R6889 Other general symptoms and signs: Secondary | ICD-10-CM

## 2021-06-15 DIAGNOSIS — R112 Nausea with vomiting, unspecified: Secondary | ICD-10-CM | POA: Insufficient documentation

## 2021-06-15 DIAGNOSIS — E1165 Type 2 diabetes mellitus with hyperglycemia: Secondary | ICD-10-CM | POA: Diagnosis not present

## 2021-06-15 NOTE — ED Triage Notes (Signed)
Pt states that she has had vomiting, diarrhea, nasal congestion, cough, chest pain and shortness of breath. Fevers around 101, last took ibuprofen around 1400 today.

## 2021-06-15 NOTE — ED Notes (Signed)
Signature pad not working during triage, [employer] waiver read and explained to patient, she verbalized understanding of waiver, no further questions.

## 2021-06-16 LAB — CBC
HCT: 37.8 % (ref 36.0–46.0)
Hemoglobin: 12.8 g/dL (ref 12.0–15.0)
MCH: 31.7 pg (ref 26.0–34.0)
MCHC: 33.9 g/dL (ref 30.0–36.0)
MCV: 93.6 fL (ref 80.0–100.0)
Platelets: 382 10*3/uL (ref 150–400)
RBC: 4.04 MIL/uL (ref 3.87–5.11)
RDW: 12.9 % (ref 11.5–15.5)
WBC: 8.5 10*3/uL (ref 4.0–10.5)
nRBC: 0 % (ref 0.0–0.2)

## 2021-06-16 LAB — RESP PANEL BY RT-PCR (FLU A&B, COVID) ARPGX2
Influenza A by PCR: NEGATIVE
Influenza B by PCR: NEGATIVE
SARS Coronavirus 2 by RT PCR: NEGATIVE

## 2021-06-16 LAB — BASIC METABOLIC PANEL
Anion gap: 6 (ref 5–15)
BUN: 19 mg/dL (ref 6–20)
CO2: 25 mmol/L (ref 22–32)
Calcium: 8.7 mg/dL — ABNORMAL LOW (ref 8.9–10.3)
Chloride: 104 mmol/L (ref 98–111)
Creatinine, Ser: 1.27 mg/dL — ABNORMAL HIGH (ref 0.44–1.00)
GFR, Estimated: 49 mL/min — ABNORMAL LOW (ref >60)
Glucose, Bld: 355 mg/dL — ABNORMAL HIGH (ref 70–99)
Potassium: 3.8 mmol/L (ref 3.5–5.1)
Sodium: 135 mmol/L (ref 135–145)

## 2021-06-16 LAB — TROPONIN I (HIGH SENSITIVITY)
Troponin I (High Sensitivity): 8 ng/L (ref <18)
Troponin I (High Sensitivity): 8 ng/L (ref <18)

## 2021-06-16 LAB — CBG MONITORING, ED: Glucose-Capillary: 229 mg/dL — ABNORMAL HIGH (ref 70–99)

## 2021-06-16 MED ORDER — SODIUM CHLORIDE 0.9 % IV BOLUS
1000.0000 mL | Freq: Once | INTRAVENOUS | Status: AC
  Administered 2021-06-16: 1000 mL via INTRAVENOUS

## 2021-06-16 MED ORDER — ONDANSETRON HCL 4 MG/2ML IJ SOLN
4.0000 mg | Freq: Once | INTRAMUSCULAR | Status: AC
  Administered 2021-06-16: 4 mg via INTRAVENOUS
  Filled 2021-06-16: qty 2

## 2021-06-16 MED ORDER — ONDANSETRON 4 MG PO TBDP: 4.0000 mg | ORAL_TABLET | Freq: Three times a day (TID) | ORAL | 0 refills | Status: DC | PRN

## 2021-06-16 NOTE — ED Notes (Signed)
ED Provider at bedside. 
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
## 2021-06-16 NOTE — Discharge Instructions (Signed)
You were seen today for flulike symptoms.  Your COVID and influenza testing are negative; however, you likely still have a viral illness.  Take Zofran as needed for nausea and vomiting.  Take Tylenol or Motrin for body aches or fevers.

## 2021-06-24 DIAGNOSIS — R6889 Other general symptoms and signs: Secondary | ICD-10-CM | POA: Diagnosis not present

## 2021-06-24 DIAGNOSIS — Z23 Encounter for immunization: Secondary | ICD-10-CM | POA: Diagnosis not present

## 2021-06-24 DIAGNOSIS — I10 Essential (primary) hypertension: Secondary | ICD-10-CM | POA: Diagnosis not present

## 2021-06-24 DIAGNOSIS — E1121 Type 2 diabetes mellitus with diabetic nephropathy: Secondary | ICD-10-CM | POA: Diagnosis not present

## 2021-07-01 ENCOUNTER — Encounter (HOSPITAL_BASED_OUTPATIENT_CLINIC_OR_DEPARTMENT_OTHER): Payer: Self-pay | Admitting: Orthopedic Surgery

## 2021-07-05 ENCOUNTER — Encounter (HOSPITAL_BASED_OUTPATIENT_CLINIC_OR_DEPARTMENT_OTHER)
Admission: RE | Admit: 2021-07-05 | Discharge: 2021-07-05 | Disposition: A | Discharge status: Home / Self Care | Payer: BC Managed Care – PPO | Source: Ambulatory Visit | Attending: Orthopedic Surgery | Admitting: Orthopedic Surgery

## 2021-07-05 DIAGNOSIS — E119 Type 2 diabetes mellitus without complications: Secondary | ICD-10-CM | POA: Diagnosis not present

## 2021-07-05 DIAGNOSIS — I10 Essential (primary) hypertension: Secondary | ICD-10-CM | POA: Diagnosis not present

## 2021-07-05 DIAGNOSIS — G5602 Carpal tunnel syndrome, left upper limb: Secondary | ICD-10-CM | POA: Diagnosis not present

## 2021-07-05 DIAGNOSIS — K219 Gastro-esophageal reflux disease without esophagitis: Secondary | ICD-10-CM | POA: Diagnosis not present

## 2021-07-05 DIAGNOSIS — F17210 Nicotine dependence, cigarettes, uncomplicated: Secondary | ICD-10-CM | POA: Diagnosis not present

## 2021-07-05 DIAGNOSIS — Z01812 Encounter for preprocedural laboratory examination: Secondary | ICD-10-CM | POA: Insufficient documentation

## 2021-07-05 DIAGNOSIS — M1990 Unspecified osteoarthritis, unspecified site: Secondary | ICD-10-CM | POA: Diagnosis not present

## 2021-07-05 LAB — BASIC METABOLIC PANEL
Anion gap: 10 (ref 5–15)
BUN: 12 mg/dL (ref 6–20)
CO2: 23 mmol/L (ref 22–32)
Calcium: 9.2 mg/dL (ref 8.9–10.3)
Chloride: 104 mmol/L (ref 98–111)
Creatinine, Ser: 1.08 mg/dL — ABNORMAL HIGH (ref 0.44–1.00)
GFR, Estimated: 60 mL/min — ABNORMAL LOW (ref >60)
Glucose, Bld: 157 mg/dL — ABNORMAL HIGH (ref 70–99)
Potassium: 4.1 mmol/L (ref 3.5–5.1)
Sodium: 137 mmol/L (ref 135–145)

## 2021-07-05 NOTE — Anesthesia Preprocedure Evaluation (Addendum)
Anesthesia Evaluation  Patient identified by MRN, date of birth, ID band Patient awake    Reviewed: Allergy & Precautions, NPO status , Patient's Chart, lab work & pertinent test results  History of Anesthesia Complications Negative for: history of anesthetic complications  Airway Mallampati: II  TM Distance: >3 FB Neck ROM: Full    Dental  (+) Missing, Partial Upper, Loose, Poor Dentition,    Pulmonary Current Smoker and Patient abstained from smoking.,    Pulmonary exam normal        Cardiovascular hypertension, Pt. on medications Normal cardiovascular exam     Neuro/Psych Seizures -, Well Controlled,  negative psych ROS   GI/Hepatic Neg liver ROS, GERD  Controlled,  Endo/Other  diabetes, Type 2, Insulin Dependent  Renal/GU negative Renal ROS  negative genitourinary   Musculoskeletal  (+) Arthritis ,   Abdominal   Peds  Hematology  (+) REFUSES BLOOD PRODUCTS,   Anesthesia Other Findings Day of surgery medications reviewed with patient.  Reproductive/Obstetrics negative OB ROS                           Anesthesia Physical Anesthesia Plan  ASA: 2  Anesthesia Plan: Bier Block and Bier Block-LIDOCAINE ONLY   Post-op Pain Management: Tylenol PO (pre-op)   Induction:   PONV Risk Score and Plan: 1 and Treatment may vary due to age or medical condition, Propofol infusion, Ondansetron and Midazolam  Airway Management Planned: Natural Airway and Simple Face Mask  Additional Equipment: None  Intra-op Plan:   Post-operative Plan:   Informed Consent: I have reviewed the patients History and Physical, chart, labs and discussed the procedure including the risks, benefits and alternatives for the proposed anesthesia with the patient or authorized representative who has indicated his/her understanding and acceptance.       Plan Discussed with: CRNA  Anesthesia Plan Comments:         Anesthesia Quick Evaluation

## 2021-07-05 NOTE — Progress Notes (Signed)

## 2021-07-06 ENCOUNTER — Ambulatory Visit (HOSPITAL_BASED_OUTPATIENT_CLINIC_OR_DEPARTMENT_OTHER)
Admission: RE | Admit: 2021-07-06 | Discharge: 2021-07-06 | Disposition: A | Discharge status: Home / Self Care | Payer: BC Managed Care – PPO | Attending: Orthopedic Surgery | Admitting: Orthopedic Surgery

## 2021-07-06 ENCOUNTER — Other Ambulatory Visit: Payer: Self-pay

## 2021-07-06 ENCOUNTER — Encounter (HOSPITAL_BASED_OUTPATIENT_CLINIC_OR_DEPARTMENT_OTHER)
Admission: RE | Disposition: A | Discharge status: Home / Self Care | Payer: Self-pay | Source: Home / Self Care | Attending: Orthopedic Surgery

## 2021-07-06 ENCOUNTER — Ambulatory Visit (HOSPITAL_BASED_OUTPATIENT_CLINIC_OR_DEPARTMENT_OTHER): Payer: BC Managed Care – PPO | Admitting: Anesthesiology

## 2021-07-06 ENCOUNTER — Encounter (HOSPITAL_BASED_OUTPATIENT_CLINIC_OR_DEPARTMENT_OTHER): Payer: Self-pay | Admitting: Orthopedic Surgery

## 2021-07-06 DIAGNOSIS — R739 Hyperglycemia, unspecified: Secondary | ICD-10-CM

## 2021-07-06 DIAGNOSIS — F17210 Nicotine dependence, cigarettes, uncomplicated: Secondary | ICD-10-CM | POA: Diagnosis not present

## 2021-07-06 DIAGNOSIS — E785 Hyperlipidemia, unspecified: Secondary | ICD-10-CM | POA: Diagnosis not present

## 2021-07-06 DIAGNOSIS — E119 Type 2 diabetes mellitus without complications: Secondary | ICD-10-CM | POA: Diagnosis not present

## 2021-07-06 DIAGNOSIS — G5602 Carpal tunnel syndrome, left upper limb: Secondary | ICD-10-CM | POA: Insufficient documentation

## 2021-07-06 DIAGNOSIS — K219 Gastro-esophageal reflux disease without esophagitis: Secondary | ICD-10-CM | POA: Insufficient documentation

## 2021-07-06 DIAGNOSIS — I10 Essential (primary) hypertension: Secondary | ICD-10-CM | POA: Diagnosis not present

## 2021-07-06 DIAGNOSIS — E1165 Type 2 diabetes mellitus with hyperglycemia: Secondary | ICD-10-CM | POA: Diagnosis not present

## 2021-07-06 DIAGNOSIS — M1990 Unspecified osteoarthritis, unspecified site: Secondary | ICD-10-CM | POA: Insufficient documentation

## 2021-07-06 HISTORY — PX: CARPAL TUNNEL RELEASE: SHX101

## 2021-07-06 LAB — GLUCOSE, CAPILLARY
Glucose-Capillary: 136 mg/dL — ABNORMAL HIGH (ref 70–99)
Glucose-Capillary: 137 mg/dL — ABNORMAL HIGH (ref 70–99)

## 2021-07-06 SURGERY — CARPAL TUNNEL RELEASE
Anesthesia: Regional | Site: Wrist | Laterality: Left

## 2021-07-06 MED ORDER — BUPIVACAINE HCL (PF) 0.25 % IJ SOLN
INTRAMUSCULAR | Status: DC | PRN
  Administered 2021-07-06: 10 mL

## 2021-07-06 MED ORDER — ONDANSETRON HCL 4 MG/2ML IJ SOLN
INTRAMUSCULAR | Status: DC | PRN
  Administered 2021-07-06: 4 mg via INTRAVENOUS

## 2021-07-06 MED ORDER — FENTANYL CITRATE (PF) 100 MCG/2ML IJ SOLN
INTRAMUSCULAR | Status: AC
  Filled 2021-07-06: qty 2

## 2021-07-06 MED ORDER — ACETAMINOPHEN 500 MG PO TABS
ORAL_TABLET | ORAL | Status: AC
  Filled 2021-07-06: qty 2

## 2021-07-06 MED ORDER — MIDAZOLAM HCL 5 MG/5ML IJ SOLN
INTRAMUSCULAR | Status: DC | PRN
  Administered 2021-07-06: 2 mg via INTRAVENOUS

## 2021-07-06 MED ORDER — FENTANYL CITRATE (PF) 100 MCG/2ML IJ SOLN
INTRAMUSCULAR | Status: DC | PRN
  Administered 2021-07-06: 50 ug via INTRAVENOUS

## 2021-07-06 MED ORDER — LACTATED RINGERS IV SOLN: INTRAVENOUS | Status: DC

## 2021-07-06 MED ORDER — CELECOXIB 200 MG PO CAPS: 200.0000 mg | ORAL_CAPSULE | Freq: Two times a day (BID) | ORAL | 0 refills | Status: DC

## 2021-07-06 MED ORDER — CLINDAMYCIN PHOSPHATE 900 MG/50ML IV SOLN
900.0000 mg | INTRAVENOUS | Status: AC
  Administered 2021-07-06: 900 mg via INTRAVENOUS

## 2021-07-06 MED ORDER — TRAMADOL HCL 50 MG PO TABS
ORAL_TABLET | ORAL | Status: AC
  Filled 2021-07-06: qty 1

## 2021-07-06 MED ORDER — CLINDAMYCIN PHOSPHATE 900 MG/50ML IV SOLN
INTRAVENOUS | Status: AC
  Filled 2021-07-06: qty 50

## 2021-07-06 MED ORDER — MIDAZOLAM HCL 2 MG/2ML IJ SOLN
INTRAMUSCULAR | Status: AC
  Filled 2021-07-06: qty 2

## 2021-07-06 MED ORDER — LIDOCAINE HCL (PF) 0.5 % IJ SOLN
INTRAMUSCULAR | Status: DC | PRN
  Administered 2021-07-06: 30 mL via INTRAVENOUS

## 2021-07-06 MED ORDER — PROPOFOL 10 MG/ML IV BOLUS
INTRAVENOUS | Status: AC
  Filled 2021-07-06: qty 20

## 2021-07-06 MED ORDER — 0.9 % SODIUM CHLORIDE (POUR BTL) OPTIME
TOPICAL | Status: DC | PRN
  Administered 2021-07-06: 100 mL

## 2021-07-06 MED ORDER — PROPOFOL 500 MG/50ML IV EMUL
INTRAVENOUS | Status: AC
  Filled 2021-07-06: qty 50

## 2021-07-06 MED ORDER — PROMETHAZINE HCL 25 MG/ML IJ SOLN: 6.2500 mg | INTRAMUSCULAR | Status: DC | PRN

## 2021-07-06 MED ORDER — ACETAMINOPHEN 500 MG PO TABS
1000.0000 mg | ORAL_TABLET | Freq: Once | ORAL | Status: AC
  Administered 2021-07-06: 1000 mg via ORAL

## 2021-07-06 MED ORDER — CLINDAMYCIN PHOSPHATE 900 MG/50ML IV SOLN: 900.0000 mg | INTRAVENOUS | Status: DC

## 2021-07-06 MED ORDER — TRAMADOL HCL 50 MG PO TABS
50.0000 mg | ORAL_TABLET | Freq: Once | ORAL | Status: AC
  Administered 2021-07-06: 50 mg via ORAL

## 2021-07-06 MED ORDER — FENTANYL CITRATE (PF) 100 MCG/2ML IJ SOLN: 25.0000 ug | INTRAMUSCULAR | Status: DC | PRN

## 2021-07-06 MED ORDER — PROPOFOL 500 MG/50ML IV EMUL
INTRAVENOUS | Status: DC | PRN
  Administered 2021-07-06: 200 ug/kg/min via INTRAVENOUS

## 2021-07-06 SURGICAL SUPPLY — 35 items
APL PRP STRL LF DISP 70% ISPRP (MISCELLANEOUS) ×1
BLADE SURG 15 STRL LF DISP TIS (BLADE) ×1 IMPLANT
BLADE SURG 15 STRL SS (BLADE) ×2
BNDG CMPR 9X4 STRL LF SNTH (GAUZE/BANDAGES/DRESSINGS) ×1
BNDG COHESIVE 3X5 TAN ST LF (GAUZE/BANDAGES/DRESSINGS) ×2 IMPLANT
BNDG ESMARK 4X9 LF (GAUZE/BANDAGES/DRESSINGS) ×1 IMPLANT
BNDG GAUZE ELAST 4 BULKY (GAUZE/BANDAGES/DRESSINGS) ×2 IMPLANT
CHLORAPREP W/TINT 26 (MISCELLANEOUS) ×2 IMPLANT
CORD BIPOLAR FORCEPS 12FT (ELECTRODE) ×2 IMPLANT
COVER BACK TABLE 60X90IN (DRAPES) ×2 IMPLANT
COVER MAYO STAND STRL (DRAPES) ×2 IMPLANT
CUFF TOURN SGL QUICK 18X4 (TOURNIQUET CUFF) ×2 IMPLANT
DRAPE EXTREMITY T 121X128X90 (DISPOSABLE) ×2 IMPLANT
DRAPE SURG 17X23 STRL (DRAPES) ×2 IMPLANT
DRSG PAD ABDOMINAL 8X10 ST (GAUZE/BANDAGES/DRESSINGS) ×2 IMPLANT
GAUZE SPONGE 4X4 12PLY STRL (GAUZE/BANDAGES/DRESSINGS) ×2 IMPLANT
GAUZE XEROFORM 1X8 LF (GAUZE/BANDAGES/DRESSINGS) ×2 IMPLANT
GLOVE SURG ORTHO LTX SZ8 (GLOVE) ×2 IMPLANT
GLOVE SURG POLYISO LF SZ6.5 (GLOVE) ×1 IMPLANT
GLOVE SURG UNDER POLY LF SZ6.5 (GLOVE) ×2 IMPLANT
GLOVE SURG UNDER POLY LF SZ8.5 (GLOVE) ×2 IMPLANT
GOWN STRL REUS W/ TWL LRG LVL3 (GOWN DISPOSABLE) ×1 IMPLANT
GOWN STRL REUS W/TWL LRG LVL3 (GOWN DISPOSABLE) ×2
GOWN STRL REUS W/TWL XL LVL3 (GOWN DISPOSABLE) ×2 IMPLANT
NDL HYPO 27GX1-1/4 (NEEDLE) IMPLANT
NEEDLE HYPO 27GX1-1/4 (NEEDLE) ×2 IMPLANT
NS IRRIG 1000ML POUR BTL (IV SOLUTION) ×2 IMPLANT
PACK BASIN DAY SURGERY FS (CUSTOM PROCEDURE TRAY) ×2 IMPLANT
STOCKINETTE 4X48 STRL (DRAPES) ×2 IMPLANT
SUT ETHILON 4 0 PS 2 18 (SUTURE) ×2 IMPLANT
SUT VICRYL 4-0 PS2 18IN ABS (SUTURE) IMPLANT
SYR BULB EAR ULCER 3OZ GRN STR (SYRINGE) ×2 IMPLANT
SYR CONTROL 10ML LL (SYRINGE) ×1 IMPLANT
TOWEL GREEN STERILE FF (TOWEL DISPOSABLE) ×2 IMPLANT
UNDERPAD 30X36 HEAVY ABSORB (UNDERPADS AND DIAPERS) ×2 IMPLANT

## 2021-07-06 NOTE — Brief Op Note (Signed)
07/06/2021  9:13 AM  PATIENT:  Madeline Simpson  60 y.o. female  PRE-OPERATIVE DIAGNOSIS:  LEFT CARPAL TUNNEL SYNDROME  POST-OPERATIVE DIAGNOSIS:  LEFT CARPAL TUNNEL SYNDROME  PROCEDURE:  Procedure(s): LEFT CARPAL TUNNEL RELEASE (Left)  SURGEON:  Surgeon(s) and Role:    * Daryll Brod, MD - Primary  PHYSICIAN ASSISTANT:   ASSISTANTS: none   ANESTHESIA:   local, regional, and IV sedation  EBL:  1 mL   BLOOD ADMINISTERED:none  DRAINS: none   LOCAL MEDICATIONS USED:  BUPIVICAINE   SPECIMEN:  No Specimen  DISPOSITION OF SPECIMEN:  N/A  COUNTS:  YES  TOURNIQUET:   Total Tourniquet Time Documented: Forearm (Left) - 24 minutes Total: Forearm (Left) - 24 minutes   DICTATION: .Dragon Dictation  PLAN OF CARE: Discharge to home after PACU  PATIENT DISPOSITION:  PACU - hemodynamically stable.
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
## 2021-07-06 NOTE — Anesthesia Postprocedure Evaluation (Signed)
Anesthesia Post Note  Patient: Madeline Simpson  Procedure(s) Performed: LEFT CARPAL TUNNEL RELEASE (Left: Wrist)     Patient location during evaluation: PACU Anesthesia Type: Bier Block Level of consciousness: awake and alert and oriented Pain management: pain level controlled Vital Signs Assessment: post-procedure vital signs reviewed and stable Respiratory status: spontaneous breathing, nonlabored ventilation and respiratory function stable Cardiovascular status: blood pressure returned to baseline Postop Assessment: no apparent nausea or vomiting Anesthetic complications: no   No notable events documented.  Last Vitals:  Vitals:   07/06/21 0940 07/06/21 0950  BP:  118/76  Pulse: 80 82  Resp: 14 16  Temp:  36.7 C  SpO2: 93% 94%                 Marthenia Rolling

## 2021-07-06 NOTE — Transfer of Care (Signed)
Immediate Anesthesia Transfer of Care Note  Patient: Madeline Simpson  Procedure(s) Performed: LEFT CARPAL TUNNEL RELEASE (Left: Wrist)  Patient Location: PACU  Anesthesia Type:MAC and Bier block  Level of Consciousness: drowsy  Airway & Oxygen Therapy: Patient Spontanous Breathing and Patient connected to face mask oxygen  Post-op Assessment: Report given to RN and Post -op Vital signs reviewed and stable  Post vital signs: Reviewed and stable  Last Vitals:  Vitals Value Taken Time  BP    Temp    Pulse 85 07/06/21 0917  Resp    SpO2 98 % 07/06/21 0917    Last Pain:  Vitals:   07/06/21 0722  TempSrc: Oral  PainSc: 9       Patients Stated Pain Goal: 4 (57/84/69 [id number])  Complications: No notable events documented.

## 2021-07-06 NOTE — Op Note (Signed)
NAME: Madeline Simpson MEDICAL RECORD NO: [id number] DATE OF BIRTH: 09/02/1960 FACILITY: [facility] LOCATION:  SURGERY CENTER PHYSICIAN: Wynonia Sours, MD   OPERATIVE REPORT   DATE OF PROCEDURE: 07/06/21    PREOPERATIVE DIAGNOSIS: Carpal tunnel syndrome left   POSTOPERATIVE DIAGNOSIS: Same   PROCEDURE: Decompression median nerve left hand   SURGEON: Daryll Brod, M.D.   ASSISTANT: none   ANESTHESIA:  Bier block with sedation and Local   INTRAVENOUS FLUIDS:  Per anesthesia flow sheet.   ESTIMATED BLOOD LOSS:  Minimal.   COMPLICATIONS:  None.   SPECIMENS:  none   TOURNIQUET TIME:    Total Tourniquet Time Documented: Forearm (Left) - 24 minutes Total: Forearm (Left) - 24 minutes    DISPOSITION:  Stable to PACU.   INDICATIONS: Patient is a 60 year old female with history of bilateral carpal tunnel syndrome.  She has undergone release on her right side and is admitted now for release to the left.  Pre-.  Postoperative course been discussed along with risk complications.  She is aware that there is no guarantee to the surgery the possibility of infection recurrence injury to arteries nerves tendons incomplete relief of symptoms and dystrophy.  In preoperative area the patient is seen the extremity marked by both patient and surgeon antibiotic given  OPERATIVE COURSE: Patient is brought to the operating room placed in supine position with the left arm free.  A forearm IV regional anesthetic was carried out without difficulty under the direction the anesthesia department.  A longitudinal incision was made left palm carried down through subcutaneous tissue.  Bleeders were electrocauterized with bipolar.  Palmar fascia was split.  The superficial palmar arch was identified along with the flexor tendon to the ring little finger.  Retractors were placed retracting the flexor tendons median nerve radially ulnar nerve ulnarly.  The flexor retinaculum was then released on its ulnar  border.  A sewell and sect see retractor were placed between skin and forearm fascia.  Deep structures were dissected free with blunt dissection.  The proximal aspect of the flexor retinaculum distal forearm fascia was then released for approximately 2 to 2-1/2 cm proximal to the wrist crease under direct vision.  The canal was explored.  Area compression of the nerve was apparent.  Motor branch entered into muscle distally.  The wound was copiously irrigated with saline.  Skin was closed interrupted 4 nylon sutures.  Local infiltration quarter percent bupivacaine without epinephrine was given 10 cc was used.  A sterile compressive dressing with fingers free was applied.  Deflation of the tourniquet all fingers immediately pink.  She was taken to the recovery room for observation in satisfactory condition.  She will be discharged home to return to the hand center [facility] in 1 week on Celebrex 2 a day for pain.   Daryll Brod, MD Electronically signed, 07/06/21

## 2021-07-06 NOTE — Discharge Instructions (Addendum)
Hand Center Instructions Hand Surgery  Wound Care: Keep your hand elevated above the level of your heart.  Do not allow it to dangle by your side.  Keep the dressing dry and do not remove it unless your doctor advises you to do so.  He will usually change it at the time of your post-op visit.  Moving your fingers is advised to stimulate circulation but will depend on the site of your surgery.  If you have a splint applied, your doctor will advise you regarding movement.  Activity: Do not drive or operate machinery today.  Rest today and then you may return to your normal activity and work as indicated by your physician.  Diet:  Drink liquids today or eat a light diet.  You may resume a regular diet tomorrow.    General expectations: Pain for two to three days. Fingers may become slightly swollen.  Call your doctor if any of the following occur: Severe pain not relieved by pain medication. Elevated temperature. Dressing soaked with blood. Inability to move fingers. White or bluish color to fingers.     No Tylenol before 1:40pm if needed.   Post Anesthesia Home Care Instructions  Activity: Get plenty of rest for the remainder of the day. A responsible individual must stay with you for 24 hours following the procedure.  For the next 24 hours, DO NOT: -Drive a car -Paediatric nurse -Drink alcoholic beverages -Take any medication unless instructed by your physician -Make any legal decisions or sign important papers.  Meals: Start with liquid foods such as gelatin or soup. Progress to regular foods as tolerated. Avoid greasy, spicy, heavy foods. If nausea and/or vomiting occur, drink only clear liquids until the nausea and/or vomiting subsides. Call your physician if vomiting continues.  Special Instructions/Symptoms: Your throat may feel dry or sore from the anesthesia or the breathing tube placed in your throat during surgery. If this causes discomfort, gargle with warm salt  water. The discomfort should disappear within 24 hours.  If you had a scopolamine patch placed behind your ear for the management of post- operative nausea and/or vomiting:  1. The medication in the patch is effective for 72 hours, after which it should be removed.  Wrap patch in a tissue and discard in the trash. Wash hands thoroughly with soap and water. 2. You may remove the patch earlier than 72 hours if you experience unpleasant side effects which may include dry mouth, dizziness or visual disturbances. 3. Avoid touching the patch. Wash your hands with soap and water after contact with the patch.

## 2021-07-06 NOTE — Anesthesia Procedure Notes (Signed)
Anesthesia Regional Block: Bier block (IV Regional)   Pre-Anesthetic Checklist: , timeout performed,  Correct Patient, Correct Site, Correct Laterality,  Correct Procedure, Correct Position, site marked,  Risks and benefits discussed,  Surgical consent,  Pre-op evaluation,  At surgeon's request and post-op pain management  Laterality: Left  Prep: alcohol swabs       Needles:  Injection technique: Single-shot      Additional Needles:   Procedures:,,,,, intact distal pulses, Esmarch exsanguination,  Single tourniquet utilized    Narrative:  Start time: 07/06/2021 8:45 AM End time: 07/06/2021 8:46 AM

## 2021-07-07 ENCOUNTER — Encounter (HOSPITAL_BASED_OUTPATIENT_CLINIC_OR_DEPARTMENT_OTHER): Payer: Self-pay | Admitting: Orthopedic Surgery

## 2021-07-12 DIAGNOSIS — R809 Proteinuria, unspecified: Secondary | ICD-10-CM | POA: Diagnosis not present

## 2021-07-22 ENCOUNTER — Encounter: Payer: Self-pay | Admitting: Orthopaedic Surgery

## 2021-07-22 ENCOUNTER — Ambulatory Visit (INDEPENDENT_AMBULATORY_CARE_PROVIDER_SITE_OTHER): Payer: BC Managed Care – PPO | Admitting: Orthopaedic Surgery

## 2021-07-22 ENCOUNTER — Ambulatory Visit: Payer: Self-pay

## 2021-07-22 ENCOUNTER — Other Ambulatory Visit: Payer: Self-pay

## 2021-07-22 VITALS — Ht 67.5 in | Wt 190.0 lb

## 2021-07-22 DIAGNOSIS — G8929 Other chronic pain: Secondary | ICD-10-CM

## 2021-07-22 DIAGNOSIS — M25511 Pain in right shoulder: Secondary | ICD-10-CM

## 2021-07-22 MED ORDER — IBUPROFEN 800 MG PO TABS: 800.0000 mg | ORAL_TABLET | Freq: Three times a day (TID) | ORAL | 0 refills | Status: DC | PRN
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
## 2021-08-04 ENCOUNTER — Ambulatory Visit (HOSPITAL_COMMUNITY): Payer: BC Managed Care – PPO

## 2021-08-04 ENCOUNTER — Other Ambulatory Visit: Payer: Self-pay

## 2021-08-04 ENCOUNTER — Ambulatory Visit (HOSPITAL_COMMUNITY)
Admission: RE | Admit: 2021-08-04 | Discharge: 2021-08-04 | Disposition: A | Discharge status: Home / Self Care | Payer: BC Managed Care – PPO | Source: Ambulatory Visit | Attending: Orthopaedic Surgery | Admitting: Orthopaedic Surgery

## 2021-08-04 DIAGNOSIS — M75121 Complete rotator cuff tear or rupture of right shoulder, not specified as traumatic: Secondary | ICD-10-CM | POA: Diagnosis not present

## 2021-08-04 DIAGNOSIS — M25511 Pain in right shoulder: Secondary | ICD-10-CM | POA: Insufficient documentation

## 2021-08-04 DIAGNOSIS — G8929 Other chronic pain: Secondary | ICD-10-CM | POA: Insufficient documentation

## 2021-08-04 DIAGNOSIS — M19011 Primary osteoarthritis, right shoulder: Secondary | ICD-10-CM | POA: Diagnosis not present

## 2021-08-04 DIAGNOSIS — M65811 Other synovitis and tenosynovitis, right shoulder: Secondary | ICD-10-CM | POA: Diagnosis not present

## 2021-08-04 DIAGNOSIS — M25411 Effusion, right shoulder: Secondary | ICD-10-CM | POA: Diagnosis not present

## 2021-08-17 ENCOUNTER — Other Ambulatory Visit: Payer: Self-pay | Admitting: Orthopaedic Surgery

## 2021-08-20 DIAGNOSIS — G5603 Carpal tunnel syndrome, bilateral upper limbs: Secondary | ICD-10-CM | POA: Diagnosis not present

## 2021-08-26 ENCOUNTER — Ambulatory Visit: Payer: BC Managed Care – PPO | Admitting: Orthopaedic Surgery

## 2021-08-26 ENCOUNTER — Other Ambulatory Visit: Payer: Self-pay

## 2021-09-06 DIAGNOSIS — R202 Paresthesia of skin: Secondary | ICD-10-CM | POA: Diagnosis not present

## 2021-09-06 DIAGNOSIS — R52 Pain, unspecified: Secondary | ICD-10-CM | POA: Diagnosis not present

## 2021-09-06 DIAGNOSIS — G5603 Carpal tunnel syndrome, bilateral upper limbs: Secondary | ICD-10-CM | POA: Diagnosis not present

## 2021-09-06 DIAGNOSIS — M25632 Stiffness of left wrist, not elsewhere classified: Secondary | ICD-10-CM | POA: Diagnosis not present

## 2021-10-18 ENCOUNTER — Other Ambulatory Visit: Payer: Self-pay | Admitting: Orthopaedic Surgery

## 2021-10-19 DIAGNOSIS — M19011 Primary osteoarthritis, right shoulder: Secondary | ICD-10-CM | POA: Diagnosis not present

## 2021-10-19 DIAGNOSIS — M75120 Complete rotator cuff tear or rupture of unspecified shoulder, not specified as traumatic: Secondary | ICD-10-CM | POA: Diagnosis not present

## 2021-10-27 DIAGNOSIS — E1121 Type 2 diabetes mellitus with diabetic nephropathy: Secondary | ICD-10-CM | POA: Diagnosis not present

## 2021-10-27 DIAGNOSIS — Z79899 Other long term (current) drug therapy: Secondary | ICD-10-CM | POA: Diagnosis not present

## 2021-10-27 DIAGNOSIS — E113293 Type 2 diabetes mellitus with mild nonproliferative diabetic retinopathy without macular edema, bilateral: Secondary | ICD-10-CM | POA: Diagnosis not present

## 2021-10-27 DIAGNOSIS — F331 Major depressive disorder, recurrent, moderate: Secondary | ICD-10-CM | POA: Diagnosis not present

## 2021-10-27 DIAGNOSIS — E7800 Pure hypercholesterolemia, unspecified: Secondary | ICD-10-CM | POA: Diagnosis not present

## 2021-10-27 DIAGNOSIS — I10 Essential (primary) hypertension: Secondary | ICD-10-CM | POA: Diagnosis not present

## 2021-12-27 ENCOUNTER — Other Ambulatory Visit: Payer: Self-pay | Admitting: Orthopaedic Surgery

## 2022-01-16 ENCOUNTER — Other Ambulatory Visit: Payer: Self-pay | Admitting: Orthopaedic Surgery

## 2022-03-30 ENCOUNTER — Other Ambulatory Visit: Payer: Self-pay

## 2022-03-30 DIAGNOSIS — G5601 Carpal tunnel syndrome, right upper limb: Secondary | ICD-10-CM | POA: Diagnosis not present

## 2022-03-30 DIAGNOSIS — M67841 Other specified disorders of synovium, right hand: Secondary | ICD-10-CM | POA: Diagnosis not present

## 2022-06-17 ENCOUNTER — Other Ambulatory Visit: Payer: Self-pay | Admitting: Orthopaedic Surgery

## 2022-07-20 ENCOUNTER — Telehealth: Payer: Self-pay | Admitting: *Deleted

## 2022-07-20 NOTE — Patient Outreach (Signed)
  Care Coordination   07/20/2022 Name: CODIE HAINER MRN: [id number] DOB: [birth year]   Care Coordination Outreach Attempts:  An unsuccessful telephone outreach was attempted today to offer the patient information about available care coordination services as a benefit of their health plan.   Follow Up Plan:  Additional outreach attempts will be made to offer the patient care coordination information and services.   Encounter Outcome:  No Answer   Care Coordination Interventions:  No, not indicated    Raina Mina, RN Care Management Coordinator [place] Office [phone]

## 2022-08-25 ENCOUNTER — Other Ambulatory Visit: Payer: Self-pay | Admitting: Orthopaedic Surgery

## 2022-11-24 DIAGNOSIS — M792 Neuralgia and neuritis, unspecified: Secondary | ICD-10-CM | POA: Diagnosis not present

## 2023-02-25 DIAGNOSIS — I10 Essential (primary) hypertension: Secondary | ICD-10-CM | POA: Diagnosis not present

## 2023-02-25 DIAGNOSIS — E1036 Type 1 diabetes mellitus with diabetic cataract: Secondary | ICD-10-CM | POA: Diagnosis not present

## 2023-02-25 DIAGNOSIS — K5900 Constipation, unspecified: Secondary | ICD-10-CM | POA: Diagnosis not present

## 2023-02-25 DIAGNOSIS — Z720 Tobacco use: Secondary | ICD-10-CM | POA: Diagnosis not present

## 2023-02-25 DIAGNOSIS — E1043 Type 1 diabetes mellitus with diabetic autonomic (poly)neuropathy: Secondary | ICD-10-CM | POA: Diagnosis not present

## 2023-02-25 DIAGNOSIS — Z8249 Family history of ischemic heart disease and other diseases of the circulatory system: Secondary | ICD-10-CM | POA: Diagnosis not present

## 2023-02-25 DIAGNOSIS — M1990 Unspecified osteoarthritis, unspecified site: Secondary | ICD-10-CM | POA: Diagnosis not present

## 2023-02-25 DIAGNOSIS — Z823 Family history of stroke: Secondary | ICD-10-CM | POA: Diagnosis not present

## 2023-02-25 DIAGNOSIS — K219 Gastro-esophageal reflux disease without esophagitis: Secondary | ICD-10-CM | POA: Diagnosis not present

## 2023-02-25 DIAGNOSIS — E10319 Type 1 diabetes mellitus with unspecified diabetic retinopathy without macular edema: Secondary | ICD-10-CM | POA: Diagnosis not present

## 2023-02-25 DIAGNOSIS — R32 Unspecified urinary incontinence: Secondary | ICD-10-CM | POA: Diagnosis not present

## 2023-02-25 DIAGNOSIS — E1042 Type 1 diabetes mellitus with diabetic polyneuropathy: Secondary | ICD-10-CM | POA: Diagnosis not present

## 2023-11-17 ENCOUNTER — Other Ambulatory Visit: Payer: Self-pay | Admitting: Family Medicine

## 2023-11-17 DIAGNOSIS — Z1231 Encounter for screening mammogram for malignant neoplasm of breast: Secondary | ICD-10-CM

## 2024-03-14 ENCOUNTER — Other Ambulatory Visit: Payer: Self-pay | Admitting: Family Medicine

## 2024-03-14 DIAGNOSIS — Z1231 Encounter for screening mammogram for malignant neoplasm of breast: Secondary | ICD-10-CM

## 2024-04-02 ENCOUNTER — Ambulatory Visit

## 2024-04-12 ENCOUNTER — Other Ambulatory Visit: Payer: Self-pay | Admitting: Family Medicine

## 2024-04-12 DIAGNOSIS — N644 Mastodynia: Secondary | ICD-10-CM

## 2024-04-16 ENCOUNTER — Other Ambulatory Visit: Payer: Self-pay | Admitting: Family Medicine

## 2024-04-16 DIAGNOSIS — N644 Mastodynia: Secondary | ICD-10-CM

## 2024-04-23 ENCOUNTER — Ambulatory Visit

## 2024-04-25 ENCOUNTER — Ambulatory Visit
Admission: RE | Admit: 2024-04-25 | Discharge: 2024-04-25 | Disposition: A | Discharge status: Home / Self Care | Source: Ambulatory Visit | Attending: Family Medicine | Admitting: Family Medicine

## 2024-04-25 ENCOUNTER — Ambulatory Visit

## 2024-04-25 DIAGNOSIS — N644 Mastodynia: Secondary | ICD-10-CM

## 2024-05-22 ENCOUNTER — Ambulatory Visit (INDEPENDENT_AMBULATORY_CARE_PROVIDER_SITE_OTHER): Admitting: Podiatry

## 2024-05-22 ENCOUNTER — Encounter: Payer: Self-pay | Admitting: Podiatry

## 2024-05-22 DIAGNOSIS — M79674 Pain in right toe(s): Secondary | ICD-10-CM | POA: Diagnosis not present

## 2024-05-22 DIAGNOSIS — E1142 Type 2 diabetes mellitus with diabetic polyneuropathy: Secondary | ICD-10-CM

## 2024-05-22 DIAGNOSIS — M79675 Pain in left toe(s): Secondary | ICD-10-CM | POA: Diagnosis not present

## 2024-05-22 DIAGNOSIS — B351 Tinea unguium: Secondary | ICD-10-CM | POA: Diagnosis not present
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
## 2024-06-03 NOTE — Telephone Encounter (Signed)
 [facility]  Established Patient Screening Note  Khadeeja Veona Bittman is a 63 y.o. old female presenting for return patient visit.  Pain Description: Since the last visit the pain is unchanged. The most significant location of pain is the right hand and finger. Other areas of pain include the right shoulder, arm, elbow, and wrist.  The 1-2 words that best describe the pain: throbbing, aching, and crampy The pain improves with nothing. The pain is made worse with most movements and general daily activities and positional help with pain. The average daily pain score is 9/10. The pain can be as high as 10/10 at its worst. The pain interferes with: All activities and Sleeping.  Therapies: Did the patient have an injection/procedure since the last visit? No   Has the patient had any new imaging (X-ray, MRI, CT) for pain concerns since the last visit? No  Has physical therapy been initiated or completed for this pain concern? Yes - Where was physical therapy completed? At a [facility] based clinic Has a home exercise program (directed by a physical therapist or medical provider) been completed for this pain concern? Yes  Medications: Was a new medication for pain started by our clinic at the last visit? No   Is the patient on a blood thinner (including aspirin , Goody/BC/Bayer powder)? No

## 2024-08-22 ENCOUNTER — Ambulatory Visit: Admitting: Podiatry

## 2024-09-30 ENCOUNTER — Ambulatory Visit: Admitting: Podiatry

## 2024-10-17 ENCOUNTER — Ambulatory Visit: Admitting: Cardiovascular Disease
